# Patient Record
Sex: Male | Born: 1980
Health system: Southern US, Community
[De-identification: ages and names within clinical notes are randomized; demographics above are authoritative.]

## PROBLEM LIST (undated history)

## (undated) DIAGNOSIS — F419 Anxiety disorder, unspecified: Secondary | ICD-10-CM

## (undated) DIAGNOSIS — E7800 Pure hypercholesterolemia, unspecified: Secondary | ICD-10-CM

## (undated) DIAGNOSIS — F329 Major depressive disorder, single episode, unspecified: Secondary | ICD-10-CM

## (undated) HISTORY — DX: Major depressive disorder, single episode, unspecified: F32.9

## (undated) HISTORY — DX: Pure hypercholesterolemia, unspecified: E78.00

## (undated) HISTORY — DX: Anxiety disorder, unspecified: F41.9

---

## 2014-04-05 ENCOUNTER — Encounter: Payer: Self-pay | Admitting: Cardiovascular Disease

## 2014-04-07 ENCOUNTER — Encounter: Payer: Self-pay | Admitting: Cardiovascular Disease

## 2015-12-07 ENCOUNTER — Ambulatory Visit (INDEPENDENT_AMBULATORY_CARE_PROVIDER_SITE_OTHER): Payer: BLUE CROSS/BLUE SHIELD | Admitting: Psychiatry

## 2015-12-07 ENCOUNTER — Encounter (HOSPITAL_COMMUNITY): Payer: Self-pay | Admitting: Psychiatry

## 2015-12-07 VITALS — BP 159/108 | HR 67 | Ht 68.0 in | Wt 155.4 lb

## 2015-12-07 DIAGNOSIS — F411 Generalized anxiety disorder: Secondary | ICD-10-CM

## 2015-12-07 MED ORDER — VILAZODONE HCL 20 MG PO TABS: 20.0000 mg | ORAL_TABLET | Freq: Every day | ORAL | Status: DC

## 2015-12-07 MED ORDER — CLONAZEPAM 0.5 MG PO TABS: 0.2500 mg | ORAL_TABLET | Freq: Two times a day (BID) | ORAL | Status: DC | PRN

## 2015-12-07 NOTE — Progress Notes (Signed)
Psychiatric Initial Adult Assessment   Patient Identification: Jason [name] MRN:  [id number] Date of Evaluation:  12/07/2015 Referral Source: Dr. Selinda Flavin Chief Complaint:   Chief Complaint    Depression; Anxiety; Establish Care     Visit Diagnosis:    ICD-9-CM ICD-10-CM   1. Generalized anxiety disorder 300.02 F41.1     History of Present Illness:  This patient is a 35 year old married white male who lives with his wife in [place]. They have no children. He is a Tax adviser for [employer].  The patient was referred by his primary physician, Dr. Selinda Flavin for further assessment of depression and anxiety.  The patient states that he is always been a little bit on the depressive side and anxious person who is shy. However over the last 7 or 8 months he is become excessively anxious and worried. He wakes up with a feeling of dread and it doesn't go away. He's had little drive motivation or interest in doing anything that he usually enjoys. He's had several stressors. His wife has been very sick and has no energy and may have MS or another autoimmune disorder. Her doctors have not been able to figure out what is wrong. She does very little other than sleep all day.  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 He becomes anxious shy and panicky. His company was bought by a new firm and they are expecting a lot more out of him which is made him very anxious. He states that his eyes have difficulty with social anxiety. But lately he's had trouble even talking to his friends and just "can't come up with anything to say." He feels very awkward and self-conscious much of the time.  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 He also enjoys painting. He has various projects that he just can't complete. His mind races and he  can't decide what to do first. He often feels depressed and sad and feels like crying but can't. His energy is low. He denies suicidal ideation. He eats well and exercises 4-5 times a week. He doesn't use drugs and drinks very little. He sleeps very well but always feels tired. He has been tried on several antidepressants. Zoloft helped but made him very drowsy. Prozac and Celexa did not help and Wellbutrin made him feel wired. He's currently on a low dose of clonazepam but it makes him drowsy as well. He's never had past psychiatric treatment or counseling.  Associated Signs/Symptoms: Depression Symptoms:  depressed mood, anhedonia, psychomotor retardation, fatigue, feelings of worthlessness/guilt, difficulty concentrating, anxiety, loss of energy/fatigue,  Anxiety Symptoms:  Excessive Worry, Panic Symptoms, Social Anxiety,   Past Psychiatric History: none  Previous Psychotropic Medications: Yes   Substance Abuse History in the last 12 months:  No.  Consequences of Substance Abuse: NA  Past Medical History:  Past Medical History  Diagnosis Date  . Elevated cholesterol    History reviewed. No pertinent past surgical history.  Family Psychiatric History: none  Family History: History reviewed. No pertinent family history.  Social History:   Social History   Social History  . Marital Status: Married    Spouse Name: N/A  . Number of Children: N/A  . Years of Education: N/A   Social History Main Topics  . Smoking status: Former Games developer  . Smokeless tobacco:  Never Used     Comment: Quit 06-2008  . Alcohol Use: No     Comment: 12-07-15 per pt no  . Drug Use: No     Comment: 12-07-2015 per pt no  . Sexual Activity: Yes   Other Topics Concern  . None   Social History Narrative  . None    Additional Social History: Patient grew up with both parents and one older sister. He denies any history of trauma or abuse but states his parents are very poor and often had their  lights cut off. He was often embarrassed to bring friends home. 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 He has been married for 7 years but has no children.  Allergies:  No Known Allergies  Metabolic Disorder Labs: No results found for: HGBA1C, MPG No results found for: PROLACTIN No results found for: CHOL, TRIG, HDL, CHOLHDL, VLDL, LDLCALC   Current Medications: Current Outpatient Prescriptions  Medication Sig Dispense Refill  . acyclovir (ZOVIRAX) 200 MG capsule Take 200 mg by mouth 2 (two) times daily.   3  . clonazePAM (KLONOPIN) 0.5 MG tablet Take 0.5 tablets (0.25 mg total) by mouth 2 (two) times daily as needed. 60 tablet 2  . Vilazodone HCl (VIIBRYD) 20 MG TABS Take 1 tablet (20 mg total) by mouth at bedtime. 30 tablet 2   No current facility-administered medications for this visit.    Neurologic: Headache: No Seizure: No Paresthesias:No  Musculoskeletal: Strength & Muscle Tone: within normal limits Gait & Station: normal Patient leans: N/A  Psychiatric Specialty Exam: Review of Systems  Constitutional: Positive for malaise/fatigue.  Psychiatric/Behavioral: Positive for depression. The patient is nervous/anxious.     Blood pressure 159/108, pulse 67, height  (1.727 m), weight 155 lb 6.4 oz (70.489 kg), SpO2 99 %.Body mass index is 23.63 kg/(m^2).  General Appearance: Casual, Neat and Well Groomed  Eye Contact:  Fair  Speech:  Clear and Coherent  Volume:  Decreased  Mood:  Anxious and Dysphoric  Affect:  Constricted and Depressed  Thought Process:  Goal Directed  Orientation:  Full (Time, Place, and Person)  Thought Content:  Rumination  Suicidal Thoughts:  No  Homicidal Thoughts:  No  Memory:  Immediate;   Good Recent;    Good Remote;   Good  Judgement:  Good  Insight:  Fair  Psychomotor Activity:  Normal  Concentration:  Poor  Recall:  Good  Fund of Knowledge:Good  Language: Good  Akathisia:  No  Handed:  Right  AIMS (if indicated):    Assets:  Communication Skills Desire for Improvement Physical Health Resilience Social Support Vocational/Educational  ADL's:  Intact  Cognition: WNL  Sleep:  good    Treatment Plan Summary: Medication management   This patient is a 35 year old male with a history of significant anxiety depression and dominant social anxiety. He has never had any psychiatric treatment before and cognitive behavioral therapy would help him greatly. He'll be assigned a therapist here. He's tried several SSRIs but may benefit from yet another trial. He will start Viibryd 10 mg daily for 1 week and then advance to 20 mg. He can continue the clonazepam 0.25 mg as needed for anxiety. He'll return in 4 weeks  Diannia RuderOSS, DEBORAH, MD 4/12/201711:23 AM

## 2015-12-12 ENCOUNTER — Telehealth (HOSPITAL_COMMUNITY): Payer: Self-pay | Admitting: *Deleted

## 2015-12-12 NOTE — Telephone Encounter (Signed)
Prior authorization for Viibryd received. Called [employer] was told to submit online with cover my meds. Submitted online and awaiting response to be faxed.

## 2015-12-13 NOTE — Telephone Encounter (Signed)
noted 

## 2016-01-04 ENCOUNTER — Ambulatory Visit (INDEPENDENT_AMBULATORY_CARE_PROVIDER_SITE_OTHER): Payer: BLUE CROSS/BLUE SHIELD | Admitting: Psychology

## 2016-01-05 ENCOUNTER — Ambulatory Visit (INDEPENDENT_AMBULATORY_CARE_PROVIDER_SITE_OTHER): Payer: BLUE CROSS/BLUE SHIELD | Admitting: Psychiatry

## 2016-01-05 ENCOUNTER — Encounter (HOSPITAL_COMMUNITY): Payer: Self-pay | Admitting: Psychiatry

## 2016-01-05 VITALS — BP 134/79 | HR 70 | Ht 68.0 in | Wt 159.0 lb

## 2016-01-05 DIAGNOSIS — F411 Generalized anxiety disorder: Secondary | ICD-10-CM

## 2016-01-05 MED ORDER — VILAZODONE HCL 20 MG PO TABS: 20.0000 mg | ORAL_TABLET | Freq: Every day | ORAL | Status: DC

## 2016-01-05 NOTE — Progress Notes (Signed)
Patient ID: Jason [name], male   DOB: 1981-03-31, 35 y.o.   MRN: [id number]  Psychiatric  Adult follow-up  Patient Identification: Jason [name] MRN:  [id number] Date of Evaluation:  01/05/2016 Referral Source: Dr. Selinda Flavin Chief Complaint:   Chief Complaint    Depression; Anxiety; Follow-up     Visit Diagnosis:    ICD-9-CM ICD-10-CM   1. Generalized anxiety disorder 300.02 F41.1     History of Present Illness:  This patient is a 35 year old married white male who lives with his wife in [place]. They have no children. He is a Tax adviser for [employer].  The patient was referred by his primary physician, Dr. Selinda Flavin for further assessment of depression and anxiety.  The patient states that he is always been a little bit on the depressive side and anxious person who is shy. However over the last 7 or 8 months he is become excessively anxious and worried. He wakes up with a feeling of dread and it doesn't go away. He's had little drive motivation or interest in doing anything that he usually enjoys. He's had several stressors. His wife has been very sick and has no energy and may have MS or another autoimmune disorder. Her doctors have not been able to figure out what is wrong. She does very little other than sleep all day.  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 He becomes anxious shy and panicky. His company was bought by a new firm and they are expecting a lot more out of him which is made him very anxious. He states that his eyes have difficulty with social anxiety. But lately he's had trouble even talking to his friends and just "can't come up with anything to say." He feels very awkward and self-conscious much of the time.  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 He also enjoys painting. He has  various projects that he just can't complete. His mind races and he can't decide what to do first. He often feels depressed and sad and feels like crying but can't. His energy is low. He denies suicidal ideation. He eats well and exercises 4-5 times a week. He doesn't use drugs and drinks very little. He sleeps very well but always feels tired. He has been tried on several antidepressants. Zoloft helped but made him very drowsy. Prozac and Celexa did not help and Wellbutrin made him feel wired. He's currently on a low dose of clonazepam but it makes him drowsy as well. He's never had past psychiatric treatment or counseling  The patient returns after 4 weeks. He is now on Viibryd 20 mg daily. He's starting to feel little bit better and he no longer has a feeling of dread all the time. He is able to talk to people more easily. His energy has not improved that much and he still somewhat depressed. He still wishes he had a little bit more motivation.[name] He does definitely feel a difference on the viibryd and would like to give it some more time. He is just started counseling with Jonny Ruiz Rodenbaugh  Associated Signs/Symptoms: Depression Symptoms:  depressed mood, anhedonia, psychomotor retardation, fatigue, feelings of worthlessness/guilt, difficulty concentrating, anxiety, loss of energy/fatigue,  Anxiety Symptoms:  Excessive Worry, Panic Symptoms, Social Anxiety,   Past Psychiatric History: none  Previous Psychotropic Medications: Yes  Substance Abuse History in the last 12 months:  No.  Consequences of Substance Abuse: NA  Past Medical History:  Past Medical History  Diagnosis Date  . Elevated cholesterol    History reviewed. No pertinent past surgical history.  Family Psychiatric History: none  Family History: History reviewed. No pertinent family history.  Social History:   Social History   Social History  . Marital Status: Married    Spouse Name: N/A  . Number of Children:  N/A  . Years of Education: N/A   Social History Main Topics  . Smoking status: Former Games developermoker  . Smokeless tobacco: Never Used     Comment: Quit 06-2008  . Alcohol Use: No     Comment: 12-07-15 per pt no  . Drug Use: No     Comment: 12-07-2015 per pt no  . Sexual Activity: Yes   Other Topics Concern  . None   Social History Narrative    Additional Social History: Patient grew up with both parents and one older sister. He denies any history of trauma or abuse but states his parents are very poor and often had their lights cut off. He was often embarrassed to bring friends home. He was a fairly good Consulting civil engineerstudent but sometimes distracted in school and would rather draw than do his work. 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 He has been married for 7 years but has no children.  Allergies:  No Known Allergies  Metabolic Disorder Labs: No results found for: HGBA1C, MPG No results found for: PROLACTIN No results found for: CHOL, TRIG, HDL, CHOLHDL, VLDL, LDLCALC   Current Medications: Current Outpatient Prescriptions  Medication Sig Dispense Refill  . clonazePAM (KLONOPIN) 0.5 MG tablet Take 0.5 tablets (0.25 mg total) by mouth 2 (two) times daily as needed. 60 tablet 2  . Vilazodone HCl (VIIBRYD) 20 MG TABS Take 1 tablet (20 mg total) by mouth at bedtime. 30 tablet 2  . acyclovir (ZOVIRAX) 200 MG capsule Take 200 mg by mouth 2 (two) times daily. Reported on 01/05/2016  3   No current facility-administered medications for this visit.    Neurologic: Headache: No Seizure: No Paresthesias:No  Musculoskeletal: Strength & Muscle Tone: within normal limits Gait & Station: normal Patient leans: N/A  Psychiatric Specialty Exam: Review of Systems  Constitutional: Positive for malaise/fatigue.  Psychiatric/Behavioral: Positive for depression. The patient  is nervous/anxious.     Blood pressure 134/79, pulse 70, height 5\' 8"  (1.727 m), weight 159 lb (72.122 kg), SpO2 97 %.Body mass index is 24.18 kg/(m^2).  General Appearance: Casual, Neat and Well Groomed  Eye Contact:  Fair  Speech:  Clear and Coherent  Volume:  Decreased  Mood:  A little anxious   Affect:  Mildly constricted, better   Thought Process:  Goal Directed  Orientation:  Full (Time, Place, and Person)  Thought Content:  Rumination  Suicidal Thoughts:  No  Homicidal Thoughts:  No  Memory:  Immediate;   Good Recent;   Good Remote;   Good  Judgement:  Good  Insight:  Fair  Psychomotor Activity:  Normal  Concentration:  Poor  Recall:  Good  Fund of Knowledge:Good  Language: Good  Akathisia:  No  Handed:  Right  AIMS (if indicated):    Assets:  Communication Skills Desire for Improvement Physical Health Resilience Social Support Vocational/Educational  ADL's:  Intact  Cognition: WNL  Sleep:  good    Treatment Plan Summary: Medication management   The patient will continue Viibryd 20 mg daily as well as clonazepam 0.25 milligrams up to twice a day as needed. He will continue his counseling and return to see me in 6 weeks   [name], MD 5/11/20173:21 PM

## 2016-01-16 ENCOUNTER — Telehealth (HOSPITAL_COMMUNITY): Payer: Self-pay | Admitting: *Deleted

## 2016-01-16 NOTE — Telephone Encounter (Signed)
Pt called stating he is experiencing side effects from his medication and would like to know if there are other options. The medication pt is experiencing side effects from is Viibryd. Per pt, he noticed that since he started medication, he had been losing a lot of hair and researched it as well. Per pt, this medication is really helping but would rather try something else that won't make him lose his hair. Pt number is [phone].

## 2016-01-17 NOTE — Telephone Encounter (Signed)
He will need to come in

## 2016-01-17 NOTE — Telephone Encounter (Signed)
Pt scheduled f/u appt.../or

## 2016-01-18 ENCOUNTER — Encounter (HOSPITAL_COMMUNITY): Payer: Self-pay | Admitting: Psychiatry

## 2016-01-18 ENCOUNTER — Ambulatory Visit (INDEPENDENT_AMBULATORY_CARE_PROVIDER_SITE_OTHER): Payer: BLUE CROSS/BLUE SHIELD | Admitting: Psychiatry

## 2016-01-18 VITALS — BP 144/90 | HR 62 | Ht 68.0 in | Wt 158.6 lb

## 2016-01-18 DIAGNOSIS — F411 Generalized anxiety disorder: Secondary | ICD-10-CM

## 2016-01-18 MED ORDER — VENLAFAXINE HCL ER 75 MG PO CP24: 75.0000 mg | ORAL_CAPSULE | Freq: Every day | ORAL | Status: DC

## 2016-01-18 NOTE — Progress Notes (Signed)
Patient ID: Jason [name], male   DOB: [birth year], 35 y.o.   MRN: [id number] Patient ID: Jason [name], male   DOB: 08/24/1981, 35 y.o.   MRN: [id number]  Psychiatric  Adult follow-up  Patient Identification: Jason [name] MRN:  [id number] Date of Evaluation:  01/18/2016 Referral Source: Jason [name] Chief Complaint:   Chief Complaint    Depression; Anxiety; Follow-up     Visit Diagnosis:    ICD-9-CM ICD-10-CM   1. Generalized anxiety disorder 300.02 F41.1     History of Present Illness:  This patient is a 35 year old married white male who lives with his wife in [place]. They have no children. He is a Tax adviser for [employer].  The patient was referred by his primary physician, Jason [name] for further assessment of depression and anxiety.  The patient states that he is always been a little bit on the depressive side and anxious person who is shy. However over the last 7 or 8 months he is become excessively anxious and worried. He wakes up with a feeling of dread and it doesn't go away. He's had little drive motivation or interest in doing anything that he usually enjoys. He's had several stressors. His wife has been very sick and has no energy and may have MS or another autoimmune disorder. Her doctors have not been able to figure out what is wrong. She does very little other than sleep all day.  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 He becomes anxious shy and panicky. His company was bought by a new firm and they are expecting a lot more out of him which is made him very anxious. He states that his eyes have difficulty with social anxiety. But lately he's had trouble even talking to his friends and just "can't come up with anything to say." He feels very awkward and self-conscious much of the time.  The patient states that he is playing music and  abandoned hopes to take this professionally at some point. He also enjoys painting. He has various projects that he just can't complete. His mind races and he can't decide what to do first. He often feels depressed and sad and feels like crying but can't. His energy is low. He denies suicidal ideation. He eats well and exercises 4-5 times a week. He doesn't use drugs and drinks very little. He sleeps very well but always feels tired. He has been tried on several antidepressants. Zoloft helped but made him very drowsy. Prozac and Celexa did not help and Wellbutrin made him feel wired. He's currently on a low dose of clonazepam but it makes him drowsy as well. He's never had past psychiatric treatment or counseling  The patient returns after 2 weeks as a work in. He has noticed over the last couple of weeks and is having a great deal of hair loss. It is clogging up the drain in the shower. He has looked it up and determined that this is coming from the [place]. He would like to try something else. He is tried numerous medicines as noted above. We discussed various options and decided try an SSRI such as Effexor as he needs to have more energy. He is continuing with his counseling  Associated Signs/Symptoms: Depression Symptoms:  depressed mood, anhedonia, psychomotor retardation, fatigue, feelings of worthlessness/guilt, difficulty concentrating, anxiety, loss of energy/fatigue,  Anxiety Symptoms:  Excessive Worry, Panic Symptoms, Social Anxiety,   Past Psychiatric History: none  Previous Psychotropic Medications: Yes   Substance Abuse History in the last 12 months:  No.  Consequences of Substance Abuse: NA  Past Medical History:  Past Medical History  Diagnosis Date  . Elevated cholesterol    No past surgical history on file.  Family Psychiatric History: none  Family History: No family history on file.  Social History:   Social History   Social History  . Marital Status:  Married    Spouse Name: N/A  . Number of Children: N/A  . Years of Education: N/A   Social History Main Topics  . Smoking status: Former Games developer  . Smokeless tobacco: Never Used     Comment: Quit 06-2008  . Alcohol Use: No     Comment: 12-07-15 per pt no  . Drug Use: No     Comment: 12-07-2015 per pt no  . Sexual Activity: Yes   Other Topics Concern  . None   Social History Narrative    Additional Social History: Patient grew up with both parents and one older sister. He denies any history of trauma or abuse but states his parents are very poor and often had their lights cut off. He was often embarrassed to bring friends home. 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 He has been married for 7 years but has no children.  Allergies:  No Known Allergies  Metabolic Disorder Labs: No results found for: HGBA1C, MPG No results found for: PROLACTIN No results found for: CHOL, TRIG, HDL, CHOLHDL, VLDL, LDLCALC   Current Medications: Current Outpatient Prescriptions  Medication Sig Dispense Refill  . acyclovir (ZOVIRAX) 200 MG capsule Take 200 mg by mouth 2 (two) times daily. Reported on 01/05/2016  3  . clonazePAM (KLONOPIN) 0.5 MG tablet Take 0.5 tablets (0.25 mg total) by mouth 2 (two) times daily as needed. 60 tablet 2  . venlafaxine XR (EFFEXOR XR) 75 MG 24 hr capsule Take 1 capsule (75 mg total) by mouth daily. 30 capsule 2   No current facility-administered medications for this visit.    Neurologic: Headache: No Seizure: No Paresthesias:No  Musculoskeletal: Strength & Muscle Tone: within normal limits Gait & Station: normal Patient leans: N/A  Psychiatric Specialty Exam: Review of Systems  Constitutional: Positive for malaise/fatigue.   Psychiatric/Behavioral: Positive for depression. The patient is nervous/anxious.     Blood pressure 144/90, pulse 62, height 5\' 8"  (1.727 m), weight 158 lb 9.6 oz (71.94 kg), SpO2 98 %.Body mass index is 24.12 kg/(m^2).  General Appearance: Casual, Neat and Well Groomed  Eye Contact:  Fair  Speech:  Clear and Coherent  Volume:  Decreased  Mood:  A little anxious   Affect:  Mildly constricted  Thought Process:  Goal Directed  Orientation:  Full (Time, Place, and Person)  Thought Content:  Rumination  Suicidal Thoughts:  No  Homicidal Thoughts:  No  Memory:  Immediate;   Good Recent;   Good Remote;   Good  Judgement:  Good  Insight:  Fair  Psychomotor Activity:  Normal  Concentration:  Poor  Recall:  Good  Fund of Knowledge:Good  Language: Good  Akathisia:  No  Handed:  Right  AIMS (if indicated):    Assets:  Communication Skills Desire for Improvement Physical Health Resilience Social Support Vocational/Educational  ADL's:  Intact  Cognition: WNL  Sleep:  good    Treatment Plan Summary: Medication management   The patient will continueclonazepam 0.25 milligrams up to twice a day as needed. He will cut down Viibryd to 10 mg for a few days while he is starting Effexor XR 75 mg every morning He will continue his counseling and return to see me in 4 weeks   Diannia RuderOSS, DEBORAH, MD 5/24/201710:42 AM

## 2016-01-25 ENCOUNTER — Other Ambulatory Visit (HOSPITAL_COMMUNITY): Payer: Self-pay | Admitting: Psychiatry

## 2016-01-25 ENCOUNTER — Telehealth (HOSPITAL_COMMUNITY): Payer: Self-pay | Admitting: *Deleted

## 2016-01-25 MED ORDER — VENLAFAXINE HCL ER 37.5 MG PO CP24: 37.5000 mg | ORAL_CAPSULE | Freq: Every day | ORAL | Status: DC

## 2016-01-25 NOTE — Telephone Encounter (Signed)
Phone call from patient, said the Effexor is keeping him so drowsy during the day he can't hardly keep his eyes open.  He was taking one in the morning, but changed to takeing one at night hoping it would be better,  but it is still the same.

## 2016-01-25 NOTE — Telephone Encounter (Signed)
Route to [place]. I sent in lower dose to pharmacy

## 2016-01-25 NOTE — Telephone Encounter (Signed)
Informed pt and he showed understanding 

## 2016-02-06 ENCOUNTER — Telehealth (HOSPITAL_COMMUNITY): Payer: Self-pay | Admitting: *Deleted

## 2016-02-06 ENCOUNTER — Ambulatory Visit (INDEPENDENT_AMBULATORY_CARE_PROVIDER_SITE_OTHER): Payer: BLUE CROSS/BLUE SHIELD | Admitting: Psychology

## 2016-02-06 NOTE — Telephone Encounter (Signed)
He will need to come back in

## 2016-02-06 NOTE — Telephone Encounter (Signed)
Pt came into office to see another provider. Per pt, the Effexor is still not working. Per pt, even with the lower dose, he do not feel like it is working. Pt number is [phone].

## 2016-02-06 NOTE — Telephone Encounter (Signed)
Called pt and sooner appt was made. Pt verbalized understanding.

## 2016-02-07 ENCOUNTER — Ambulatory Visit (INDEPENDENT_AMBULATORY_CARE_PROVIDER_SITE_OTHER): Payer: BLUE CROSS/BLUE SHIELD | Admitting: Psychiatry

## 2016-02-07 ENCOUNTER — Encounter (HOSPITAL_COMMUNITY): Payer: Self-pay | Admitting: Psychiatry

## 2016-02-07 VITALS — BP 136/86 | HR 70 | Ht 68.0 in | Wt 156.4 lb

## 2016-02-07 DIAGNOSIS — F411 Generalized anxiety disorder: Secondary | ICD-10-CM | POA: Diagnosis not present

## 2016-02-07 MED ORDER — VORTIOXETINE HBR 10 MG PO TABS: 10.0000 mg | ORAL_TABLET | Freq: Every day | ORAL | Status: DC

## 2016-02-07 NOTE — Progress Notes (Signed)
Patient ID: Jason [name], male   DOB: 27-Jan-1981, 35 y.o.   MRN: [id number] Patient ID: Jason [name], male   DOB: September 20, 1980, 20 y.o.   MRN: [id number] Patient ID: Jason [name], male   DOB: 19-Jan-1981, 35 y.o.   MRN: [id number]  Psychiatric  Adult follow-up  Patient Identification: Jason [name] MRN:  [id number] Date of Evaluation:  02/07/2016 Referral Source: Dr. Selinda Flavin Chief Complaint:   Chief Complaint    Depression; Anxiety; Follow-up     Visit Diagnosis:    ICD-9-CM ICD-10-CM   1. Generalized anxiety disorder 300.02 F41.1     History of Present Illness:  This patient is a 35 year old married white male who lives with his wife in [place]. They have no children. He is a Tax adviser for [employer].  The patient was referred by his primary physician, Dr. Selinda Flavin for further assessment of depression and anxiety.  The patient states that he is always been a little bit on the depressive side and anxious person who is shy. However over the last 7 or 8 months he is become excessively anxious and worried. He wakes up with a feeling of dread and it doesn't go away. He's had little drive motivation or interest in doing anything that he usually enjoys. He's had several stressors. His wife has been very sick and has no energy and may have MS or another autoimmune disorder. Her doctors have not been able to figure out what is wrong. She does very little other than sleep all day.  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 He becomes anxious shy and panicky. His company was bought by a new firm and they are expecting a lot more out of him which is made him very anxious. He states that his eyes have difficulty with social anxiety. But lately he's had trouble even talking to his friends and just "can't come up with anything to say." He feels very awkward and  self-conscious much of the time.  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 He also enjoys painting. He has various projects that he just can't complete. His mind races and he can't decide what to do first. He often feels depressed and sad and feels like crying but can't. His energy is low. He denies suicidal ideation. He eats well and exercises 4-5 times a week. He doesn't use drugs and drinks very little. He sleeps very well but always feels tired. He has been tried on several antidepressants. Zoloft helped but made him very drowsy. Prozac and Celexa did not help and Wellbutrin made him feel wired. He's currently on a low dose of clonazepam but it makes him drowsy as well. He's never had past psychiatric treatment or counseling  The patient returns after 2 weeks as a work in. Last visit we had to stop the Viibryd because he was having some much hair loss. He was changed to Effexor XR 37.5 mg. This medication is making him feel totally drained tired and unable to function. He is now tried quite a few antidepressants. He's not sure that he actually gave Prozac a good enough chance when he was younger but would like to try something new or so I suggested we moved to Trintellix. If this doesn't work out we can always go back to Prozac  or try Lexapro.  Associated Signs/Symptoms: Depression Symptoms:  depressed mood, anhedonia, psychomotor retardation, fatigue, feelings of worthlessness/guilt, difficulty concentrating, anxiety, loss of energy/fatigue,  Anxiety Symptoms:  Excessive Worry, Panic Symptoms, Social Anxiety,   Past Psychiatric History: none  Previous Psychotropic Medications: Yes   Substance Abuse History in the last 12 months:  No.  Consequences of Substance Abuse: NA  Past Medical History:  Past Medical History  Diagnosis Date  . Elevated cholesterol    History reviewed. No pertinent past surgical  history.  Family Psychiatric History: none  Family History: History reviewed. No pertinent family history.  Social History:   Social History   Social History  . Marital Status: Married    Spouse Name: N/A  . Number of Children: N/A  . Years of Education: N/A   Social History Main Topics  . Smoking status: Former Games developermoker  . Smokeless tobacco: Never Used     Comment: Quit 06-2008  . Alcohol Use: No     Comment: 12-07-15 per pt no  . Drug Use: No     Comment: 12-07-2015 per pt no  . Sexual Activity: Yes   Other Topics Concern  . None   Social History Narrative    Additional Social History: Patient grew up with both parents and one older sister. He denies any history of trauma or abuse but states his parents are very poor and often had their lights cut off. He was often embarrassed to bring friends home. He was a fairly good Consulting civil engineerstudent but sometimes distracted in school and would rather draw than do his work. 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 He has been married for 7 years but has no children.  Allergies:  No Known Allergies  Metabolic Disorder Labs: No results found for: HGBA1C, MPG No results found for: PROLACTIN No results found for: CHOL, TRIG, HDL, CHOLHDL, VLDL, LDLCALC   Current Medications: Current Outpatient Prescriptions  Medication Sig Dispense Refill  . acyclovir (ZOVIRAX) 200 MG capsule Take 200 mg by mouth 2 (two) times daily. Reported on 01/05/2016  3  . clonazePAM (KLONOPIN) 0.5 MG tablet Take 0.5 tablets (0.25 mg total) by mouth 2 (two) times daily as needed. 60 tablet 2  . vortioxetine HBr (TRINTELLIX) 10 MG TABS Take 1 tablet (10 mg total) by mouth daily. 30 tablet 2   No current facility-administered medications for this visit.    Neurologic: Headache: No Seizure: No Paresthesias:No  Musculoskeletal: Strength &  Muscle Tone: within normal limits Gait & Station: normal Patient leans: N/A  Psychiatric Specialty Exam: Review of Systems  Constitutional: Positive for malaise/fatigue.  Psychiatric/Behavioral: Positive for depression. The patient is nervous/anxious.     Blood pressure 136/86, pulse 70, height 5\' 8"  (1.727 m), weight 156 lb 6.4 oz (70.943 kg), SpO2 98 %.Body mass index is 23.79 kg/(m^2).  General Appearance: Casual, Neat and Well Groomed  Eye Contact:  Fair  Speech:  Clear and Coherent  Volume:  Decreased  Mood:   Anxious, states he also feels depressed   Affect:  Mildly constricted  Thought Process:  Goal Directed  Orientation:  Full (Time, Place, and Person)  Thought Content:  Rumination  Suicidal Thoughts:  No  Homicidal Thoughts:  No  Memory:  Immediate;   Good Recent;   Good Remote;   Good  Judgement:  Good  Insight:  Fair  Psychomotor Activity:  Normal  Concentration:  Poor  Recall:  Good  Fund of Knowledge:Good  Language: Good  Akathisia:  No  Handed:  Right  AIMS (if indicated):    Assets:  Communication Skills Desire for Improvement Physical Health Resilience Social Support Vocational/Educational  ADL's:  Intact  Cognition: WNL  Sleep:  good    Treatment Plan Summary: Medication management   The patient will continueclonazepam 0.25 milligrams up to twice a day as needed. He We'll discontinue Effexor XR and start print Trintellix 5 mg daily for 1 week and then advance to 10 mg daily. He will continue his counseling and return to see me in 4 weeks   [name], MD 6/13/201710:40 AM

## 2016-02-15 ENCOUNTER — Ambulatory Visit (HOSPITAL_COMMUNITY): Payer: BLUE CROSS/BLUE SHIELD | Admitting: Psychiatry

## 2016-02-15 ENCOUNTER — Telehealth (HOSPITAL_COMMUNITY): Payer: Self-pay | Admitting: *Deleted

## 2016-02-15 NOTE — Telephone Encounter (Signed)
Prior authorization for Trintellix received. Submitted online with cover my meds. Awaiting decision.

## 2016-02-16 ENCOUNTER — Ambulatory Visit (HOSPITAL_COMMUNITY): Payer: BLUE CROSS/BLUE SHIELD | Admitting: Psychiatry

## 2016-02-16 NOTE — Telephone Encounter (Signed)
noted 

## 2016-02-16 NOTE — Telephone Encounter (Signed)
Pt is aware and verbalized understanding.

## 2016-03-13 ENCOUNTER — Ambulatory Visit (HOSPITAL_COMMUNITY): Payer: BLUE CROSS/BLUE SHIELD | Admitting: Psychology

## 2016-03-14 ENCOUNTER — Ambulatory Visit (INDEPENDENT_AMBULATORY_CARE_PROVIDER_SITE_OTHER): Payer: BLUE CROSS/BLUE SHIELD | Admitting: Psychiatry

## 2016-03-14 ENCOUNTER — Encounter (HOSPITAL_COMMUNITY): Payer: Self-pay | Admitting: Psychiatry

## 2016-03-14 VITALS — BP 142/83 | HR 66 | Ht 68.0 in | Wt 161.2 lb

## 2016-03-14 DIAGNOSIS — F411 Generalized anxiety disorder: Secondary | ICD-10-CM

## 2016-03-14 MED ORDER — VORTIOXETINE HBR 10 MG PO TABS: 10.0000 mg | ORAL_TABLET | Freq: Every day | ORAL | Status: DC

## 2016-03-14 MED ORDER — CLONAZEPAM 0.5 MG PO TABS: 0.2500 mg | ORAL_TABLET | Freq: Two times a day (BID) | ORAL | Status: DC | PRN

## 2016-03-14 NOTE — Progress Notes (Signed)
Patient ID: Jason [name], male   DOB: Jan 09, 1981, 35 y.o.   MRN: [id number] Patient ID: Jason [name], male   DOB: Jun 26, 1981, 35 y.o.   MRN: [id number] Patient ID: Jason [name], male   DOB: 05-12-1981, 35 y.o.   MRN: [id number]  Psychiatric  Adult follow-up  Patient Identification: Jason [name] MRN:  [id number] Date of Evaluation:  03/14/2016 Referral Source: Dr. Selinda Flavin Chief Complaint:   Chief Complaint    Depression; Anxiety; Follow-up     Visit Diagnosis:    ICD-9-CM ICD-10-CM   1. Generalized anxiety disorder 300.02 F41.1     History of Present Illness:  This patient is a 35 year old married white male who lives with his wife in [place]. They have no children. He is a Tax adviser for [employer].  The patient was referred by his primary physician, Dr. Selinda Flavin for further assessment of depression and anxiety.  The patient states that he is always been a little bit on the depressive side and anxious person who is shy. However over the last 7 or 8 months he is become excessively anxious and worried. He wakes up with a feeling of dread and it doesn't go away. He's had little drive motivation or interest in doing anything that he usually enjoys. He's had several stressors. His wife has been very sick and has no energy and may have MS or another autoimmune disorder. Her doctors have not been able to figure out what is wrong. She does very little other than sleep all day.  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 He becomes anxious shy and panicky. His company was bought by a new firm and they are expecting a lot more out of him which is made him very anxious. He states that his eyes have difficulty with social anxiety. But lately he's had trouble even talking to his friends and just "can't come up with anything to say." He feels very awkward and  self-conscious much of the time.  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 He also enjoys painting. He has various projects that he just can't complete. His mind races and he can't decide what to do first. He often feels depressed and sad and feels like crying but can't. His energy is low. He denies suicidal ideation. He eats well and exercises 4-5 times a week. He doesn't use drugs and drinks very little. He sleeps very well but always feels tired. He has been tried on several antidepressants. Zoloft helped but made him very drowsy. Prozac and Celexa did not help and Wellbutrin made him feel wired. He's currently on a low dose of clonazepam but it makes him drowsy as well. He's never had past psychiatric treatment or counseling  The patient returns after 4 weeks. Last time he was having a good deal of hair loss from Viibryd so we have switched him to Trintellix. He is currently on 10 mg and doing well. His mood is been stable he is less anxious and apprehensive at work and he is sleeping very well. It makes him a bit drowsy so he takes it in the evening with food. His energy is good and he doesn't have any specific complaints. He is having much less hair loss now  Associated Signs/Symptoms: Depression Symptoms:  depressed mood, anhedonia, psychomotor  retardation, fatigue, feelings of worthlessness/guilt, difficulty concentrating, anxiety, loss of energy/fatigue,  Anxiety Symptoms:  Excessive Worry, Panic Symptoms, Social Anxiety,   Past Psychiatric History: none  Previous Psychotropic Medications: Yes   Substance Abuse History in the last 12 months:  No.  Consequences of Substance Abuse: NA  Past Medical History:  Past Medical History  Diagnosis Date  . Elevated cholesterol    History reviewed. No pertinent past surgical history.  Family Psychiatric History: none  Family History: History reviewed. No pertinent  family history.  Social History:   Social History   Social History  . Marital Status: Married    Spouse Name: N/A  . Number of Children: N/A  . Years of Education: N/A   Social History Main Topics  . Smoking status: Former Games developermoker  . Smokeless tobacco: Never Used     Comment: Quit 06-2008  . Alcohol Use: No     Comment: 12-07-15 per pt no  . Drug Use: No     Comment: 12-07-2015 per pt no  . Sexual Activity: Yes   Other Topics Concern  . None   Social History Narrative    Additional Social History: Patient grew up with both parents and one older sister. He denies any history of trauma or abuse but states his parents are very poor and often had their lights cut off. He was often embarrassed to bring friends home. He was a fairly good Consulting civil engineerstudent but sometimes distracted in school and would rather draw than do his work. 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 He has been married for 7 years but has no children.  Allergies:  No Known Allergies  Metabolic Disorder Labs: No results found for: HGBA1C, MPG No results found for: PROLACTIN No results found for: CHOL, TRIG, HDL, CHOLHDL, VLDL, LDLCALC   Current Medications: Current Outpatient Prescriptions  Medication Sig Dispense Refill  . acyclovir (ZOVIRAX) 200 MG capsule Take 200 mg by mouth 2 (two) times daily. Reported on 01/05/2016  3  . clonazePAM (KLONOPIN) 0.5 MG tablet Take 0.5 tablets (0.25 mg total) by mouth 2 (two) times daily as needed. 60 tablet 2  . vortioxetine HBr (TRINTELLIX) 10 MG TABS Take 1 tablet (10 mg total) by mouth daily. 30 tablet 2   No current facility-administered medications for this visit.    Neurologic: Headache: No Seizure: No Paresthesias:No  Musculoskeletal: Strength & Muscle Tone: within normal limits Gait & Station: normal Patient leans: N/A  Psychiatric  Specialty Exam: Review of Systems  Constitutional: Positive for malaise/fatigue.  Psychiatric/Behavioral: Positive for depression. The patient is nervous/anxious.     Blood pressure 142/83, pulse 66, height 5\' 8"  (1.727 m), weight 161 lb 3.2 oz (73.12 kg).Body mass index is 24.52 kg/(m^2).  General Appearance: Casual, Neat and Well Groomed  Eye Contact:  Fair  Speech:  Clear and Coherent  Volume:  Decreased  Mood:  Good   Affect:  Brighter   Thought Process:  Goal Directed  Orientation:  Full (Time, Place, and Person)  Thought Content:  Rumination  Suicidal Thoughts:  No  Homicidal Thoughts:  No  Memory:  Immediate;   Good Recent;   Good Remote;   Good  Judgement:  Good  Insight:  Fair  Psychomotor Activity:  Normal  Concentration:  Poor  Recall:  Good  Fund of Knowledge:Good  Language: Good  Akathisia:  No  Handed:  Right  AIMS (if indicated):    Assets:  Communication Skills Desire for Improvement Physical Health Resilience Social Support Vocational/Educational  ADL's:  Intact  Cognition: WNL  Sleep:  good    Treatment Plan Summary: Medication management   The patient will continueclonazepam 0.25 milligrams up to twice a day as needed. He will continue  Trintellix   10 mg daily. He will continue his counseling and return to see me in 3 months   [name], MD 7/19/20178:43 AM

## 2016-03-23 ENCOUNTER — Ambulatory Visit (HOSPITAL_COMMUNITY): Payer: BLUE CROSS/BLUE SHIELD | Admitting: Psychology

## 2016-06-11 ENCOUNTER — Encounter (HOSPITAL_COMMUNITY): Payer: Self-pay | Admitting: Psychiatry

## 2016-06-11 ENCOUNTER — Ambulatory Visit (INDEPENDENT_AMBULATORY_CARE_PROVIDER_SITE_OTHER): Payer: BLUE CROSS/BLUE SHIELD | Admitting: Psychiatry

## 2016-06-11 VITALS — BP 125/85 | HR 63 | Ht 68.0 in | Wt 159.2 lb

## 2016-06-11 DIAGNOSIS — Z87891 Personal history of nicotine dependence: Secondary | ICD-10-CM

## 2016-06-11 DIAGNOSIS — F411 Generalized anxiety disorder: Secondary | ICD-10-CM

## 2016-06-11 MED ORDER — VORTIOXETINE HBR 20 MG PO TABS: 20.0000 mg | ORAL_TABLET | Freq: Every day | ORAL | 2 refills | Status: DC

## 2016-06-11 MED ORDER — BUSPIRONE HCL 10 MG PO TABS: 10.0000 mg | ORAL_TABLET | Freq: Three times a day (TID) | ORAL | 2 refills | Status: DC

## 2016-06-11 MED ORDER — CLONAZEPAM 0.5 MG PO TABS: 0.2500 mg | ORAL_TABLET | Freq: Two times a day (BID) | ORAL | 2 refills | Status: DC | PRN

## 2016-06-11 NOTE — Progress Notes (Signed)
Patient ID: Jason [name], male   DOB: 21-Dec-1980, 35 y.o.   MRN: [id number] Patient ID: Jason [name], male   DOB: 1981-01-15, 35 y.o.   MRN: [id number] Patient ID: Jason [name], male   DOB: 15-May-1981, 35 y.o.   MRN: [id number]  Psychiatric  Adult follow-up  Patient Identification: Jason [name] MRN:  [id number] Date of Evaluation:  06/11/2016 Referral Source: Dr. Selinda [name] Chief Complaint:   Chief Complaint    Depression; Anxiety; Follow-up     Visit Diagnosis:    ICD-9-CM ICD-10-CM   1. Generalized anxiety disorder 300.02 F41.1     History of Present Illness:  This patient is a 35 year old married white male who lives with his wife in [place]. They have no children. He is a Tax adviser for [employer].  The patient was referred by his primary physician, Dr. Selinda [name] for further assessment of depression and anxiety.  The patient states that he is always been a little bit on the depressive side and anxious person who is shy. However over the last 7 or 8 months he is become excessively anxious and worried. He wakes up with a feeling of dread and it doesn't go away. He's had little drive motivation or interest in doing anything that he usually enjoys. He's had several stressors. His wife has been very sick and has no energy and may have MS or another autoimmune disorder. Her doctors have not been able to figure out what is wrong. She does very little other than sleep all day.  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 He becomes anxious shy and panicky. His company was bought by a new firm and they are expecting a lot more out of him which is made him very anxious. He states that his eyes have difficulty with social anxiety. But lately he's had trouble even talking to his friends and just "can't come up with anything to say." He feels very awkward and  self-conscious much of the time.  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 He also enjoys painting. He has various projects that he just can't complete. His mind races and he can't decide what to do first. He often feels depressed and sad and feels like crying but can't. His energy is low. He denies suicidal ideation. He eats well and exercises 4-5 times a week. He doesn't use drugs and drinks very little. He sleeps very well but always feels tired. He has been tried on several antidepressants. Zoloft helped but made him very drowsy. Prozac and Celexa did not help and Wellbutrin made him feel wired. He's currently on a low dose of clonazepam but it makes him drowsy as well. He's never had past psychiatric treatment or counseling  The patient returns after 3 months. He still not doing that well. At first the trintellix seems like it was helping. He has less depressed but he still has no energy or motivation to do anything. His job drains him and he is applying for some other jobs. He still has significant social anxiety. He uses the clonazepam but pretty much only takes it at bedtime because it makes him drowsy if he takes it in the day. I suggested that first we increase the trintellix to 20 mg and add BuSpar during the day for anxiety. He  was seeing Jason [name] here but didn't feel like it was that helpful and I suggested because insurance for other recommendations for therapy.  Associated Signs/Symptoms: Depression Symptoms:  depressed mood, anhedonia, psychomotor retardation, fatigue, feelings of worthlessness/guilt, difficulty concentrating, anxiety, loss of energy/fatigue,  Anxiety Symptoms:  Excessive Worry, Panic Symptoms, Social Anxiety,   Past Psychiatric History: none  Previous Psychotropic Medications: Yes   Substance Abuse History in the last 12 months:  No.  Consequences of Substance Abuse: NA  Past  Medical History:  Past Medical History:  Diagnosis Date  . Elevated cholesterol    No past surgical history on file.  Family Psychiatric History: none  Family History: No family history on file.  Social History:   Social History   Social History  . Marital status: Married    Spouse name: N/A  . Number of children: N/A  . Years of education: N/A   Social History Main Topics  . Smoking status: Former Games developermoker  . Smokeless tobacco: Never Used     Comment: Quit 06-2008  . Alcohol use No     Comment: 12-07-15 per pt no  . Drug use: No     Comment: 12-07-2015 per pt no  . Sexual activity: Yes   Other Topics Concern  . None   Social History Narrative  . None    Additional Social History: Patient grew up with both parents and one older sister. He denies any history of trauma or abuse but states his parents are very poor and often had their lights cut off. He was often embarrassed to bring friends home. He was a fairly good Consulting civil engineerstudent but sometimes distracted in school and would rather draw than do his work. 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 He has been married for 7 years but has no children.  Allergies:  No Known Allergies  Metabolic Disorder Labs: No results found for: HGBA1C, MPG No results found for: PROLACTIN No results found for: CHOL, TRIG, HDL, CHOLHDL, VLDL, LDLCALC   Current Medications: Current Outpatient Prescriptions  Medication Sig Dispense Refill  . acyclovir (ZOVIRAX) 200 MG capsule Take 200 mg by mouth 2 (two) times daily. Reported on 01/05/2016  3  . clonazePAM (KLONOPIN) 0.5 MG tablet Take 0.5 tablets (0.25 mg total) by mouth 2 (two) times daily as needed. 60 tablet 2  . busPIRone (BUSPAR) 10 MG tablet Take 1 tablet (10 mg total) by mouth 3 (three) times daily. 90 tablet 2  . vortioxetine HBr (TRINTELLIX) 20 MG TABS Take 20  mg by mouth daily. 30 tablet 2   No current facility-administered medications for this visit.     Neurologic: Headache: No Seizure: No Paresthesias:No  Musculoskeletal: Strength & Muscle Tone: within normal limits Gait & Station: normal Patient leans: N/A  Psychiatric Specialty Exam: Review of Systems  Constitutional: Positive for malaise/fatigue.  Psychiatric/Behavioral: Positive for depression. The patient is nervous/anxious.     Blood pressure 125/85, pulse 63, height 5\' 8"  (1.727 m), weight 159 lb 3.2 oz (72.2 kg).Body mass index is 24.21 kg/m.  General Appearance: Casual, Neat and Well Groomed  Eye Contact:  Fair  Speech:  Clear and Coherent  Volume:  Decreased  Mood:  Anxious   Affect:Congruent   Thought Process:  Goal Directed  Orientation:  Full (Time, Place, and Person)  Thought Content:  Rumination  Suicidal Thoughts:  No  Homicidal Thoughts:  No  Memory:  Immediate;   Good Recent;   Good Remote;   Good  Judgement:  Good  Insight:  Fair  Psychomotor Activity:  Normal  Concentration:  Poor  Recall:  Good  Fund of Knowledge:Good  Language: Good  Akathisia:  No  Handed:  Right  AIMS (if indicated):    Assets:  Communication Skills Desire for Improvement Physical Health Resilience Social Support Vocational/Educational  ADL's:  Intact  Cognition: WNL  Sleep:  good    Treatment Plan Summary: Medication management   The patient will continueclonazepam 0.25 milligrams up to twice a day as needed. He will continue  Trintellix  But increase the dose to 20 mg daily. He will start BuSpar 10 mg 3 times a day. He'll return to see me in 4 weeks   [name], MD 10/16/20174:41 PM

## 2016-06-14 ENCOUNTER — Ambulatory Visit (HOSPITAL_COMMUNITY): Payer: BLUE CROSS/BLUE SHIELD | Admitting: Psychiatry

## 2016-07-11 ENCOUNTER — Ambulatory Visit (HOSPITAL_COMMUNITY): Payer: BLUE CROSS/BLUE SHIELD | Admitting: Psychiatry

## 2016-07-17 ENCOUNTER — Encounter (HOSPITAL_COMMUNITY): Payer: Self-pay | Admitting: Psychiatry

## 2016-07-17 ENCOUNTER — Ambulatory Visit (INDEPENDENT_AMBULATORY_CARE_PROVIDER_SITE_OTHER): Payer: BLUE CROSS/BLUE SHIELD | Admitting: Psychiatry

## 2016-07-17 VITALS — BP 131/87 | HR 72 | Resp 18 | Wt 159.2 lb

## 2016-07-17 DIAGNOSIS — Z79899 Other long term (current) drug therapy: Secondary | ICD-10-CM | POA: Diagnosis not present

## 2016-07-17 DIAGNOSIS — Z87891 Personal history of nicotine dependence: Secondary | ICD-10-CM

## 2016-07-17 DIAGNOSIS — F411 Generalized anxiety disorder: Secondary | ICD-10-CM | POA: Diagnosis not present

## 2016-07-17 MED ORDER — CLONAZEPAM 0.5 MG PO TABS: 0.2500 mg | ORAL_TABLET | Freq: Two times a day (BID) | ORAL | 2 refills | Status: DC | PRN

## 2016-07-17 MED ORDER — BUSPIRONE HCL 10 MG PO TABS: 10.0000 mg | ORAL_TABLET | Freq: Three times a day (TID) | ORAL | 2 refills | Status: DC

## 2016-07-17 NOTE — Progress Notes (Signed)
Patient ID: Jason [name], male   DOB: 03/04/1981, 35 y.o.   MRN: [id number] Patient ID: Jason [name], male   DOB: 02/18/1981, 35 y.o.   MRN: [id number] Patient ID: Jason [name], male   DOB: 06/05/1981, 35 y.o.   MRN: [id number]  Psychiatric  Adult follow-up  Patient Identification: Jason [name] MRN:  [id number] Date of Evaluation:  07/17/2016 Referral Source: Dr. Selinda FlavinKevin Howard Chief Complaint:   Chief Complaint    Depression; Anxiety; Follow-up     Visit Diagnosis:    ICD-9-CM ICD-10-CM   1. Generalized anxiety disorder 300.02 F41.1     History of Present Illness:  This patient is a 35 year old married white male who lives with his wife in [place]. They have no children. 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  The patient was referred by his primary physician, Dr. Selinda FlavinKevin Howard for further assessment of depression and anxiety.  The patient states that he is always been a little bit on the depressive side and anxious person who is shy. However over the last 7 or 8 months he is become excessively anxious and worried. He wakes up with a feeling of dread and it doesn't go away. He's had little drive motivation or interest in doing anything that he usually enjoys. He's had several stressors. His wife has been very sick and has no energy and may have MS or another autoimmune disorder. Her doctors have not been able to figure out what is wrong. She does very little other than sleep all day.  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 He becomes anxious shy and panicky. His company was bought by a new firm and they are expecting a lot more out of him which is made him very anxious. He states that his eyes have difficulty with social anxiety. But lately he's had trouble even talking to his friends and just "can't come up with anything to say." He feels very awkward and  self-conscious much of the time.  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 He also enjoys painting. He has various projects that he just can't complete. His mind races and he can't decide what to do first. He often feels depressed and sad and feels like crying but can't. His energy is low. He denies suicidal ideation. He eats well and exercises 4-5 times a week. He doesn't use drugs and drinks very little. He sleeps very well but always feels tired. He has been tried on several antidepressants. Zoloft helped but made him very drowsy. Prozac and Celexa did not help and Wellbutrin made him feel wired. He's currently on a low dose of clonazepam but it makes him drowsy as well. He's never had past psychiatric treatment or counseling  The patient returns after 4 weeks. He states he had to stop the trintellix because the increased dose caused nausea. He tried the half dose but it was still making him feel weird and he stopped it. He states he is doing much better on just the BuSpar and clonazepam. His anxiety is lessened and he is having less social phobia. He denies being depressed at the moment. He would like to keep his medications the same  Associated Signs/Symptoms: Depression Symptoms:  depressed mood, anhedonia, psychomotor retardation, fatigue, feelings of worthlessness/guilt, difficulty concentrating, anxiety, loss of energy/fatigue,  Anxiety Symptoms:  Excessive Worry, Panic Symptoms, Social Anxiety,   Past Psychiatric History: none  Previous Psychotropic Medications: Yes   Substance Abuse History in the last 12 months:  No.  Consequences of Substance Abuse: NA  Past Medical History:  Past Medical History:  Diagnosis Date  . Elevated cholesterol    No past surgical history on file.  Family Psychiatric History: none  Family History: No family history on file.  Social History:   Social History   Social  History  . Marital status: Married    Spouse name: N/A  . Number of children: N/A  . Years of education: N/A   Social History Main Topics  . Smoking status: Former Games developermoker  . Smokeless tobacco: Never Used     Comment: Quit 06-2008  . Alcohol use No     Comment: 12-07-15 per pt no  . Drug use: No     Comment: 12-07-2015 per pt no  . Sexual activity: Yes   Other Topics Concern  . None   Social History Narrative  . None    Additional Social History: Patient grew up with both parents and one older sister. He denies any history of trauma or abuse but states his parents are very poor and often had their lights cut off. He was often embarrassed to bring friends home. He was a fairly good Consulting civil engineerstudent but sometimes distracted in school and would rather draw than do his work. 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 He has been married for 7 years but has no children.  Allergies:  No Known Allergies  Metabolic Disorder Labs: No results found for: HGBA1C, MPG No results found for: PROLACTIN No results found for: CHOL, TRIG, HDL, CHOLHDL, VLDL, LDLCALC   Current Medications: Current Outpatient Prescriptions  Medication Sig Dispense Refill  . acyclovir (ZOVIRAX) 200 MG capsule Take 200 mg by mouth 2 (two) times daily. Reported on 01/05/2016  3  . busPIRone (BUSPAR) 10 MG tablet Take 1 tablet (10 mg total) by mouth 3 (three) times daily. 90 tablet 2  . clonazePAM (KLONOPIN) 0.5 MG tablet Take 0.5 tablets (0.25 mg total) by mouth 2 (two) times daily as needed. 60 tablet 2   No current facility-administered medications for this visit.     Neurologic: Headache: No Seizure: No Paresthesias:No  Musculoskeletal: Strength & Muscle Tone: within normal limits Gait & Station: normal Patient leans: N/A  Psychiatric Specialty Exam: Review of Systems   Constitutional: Positive for malaise/fatigue.  Psychiatric/Behavioral: Positive for depression. The patient is nervous/anxious.     Blood pressure 131/87, pulse 72, resp. rate 18, weight 159 lb 3.2 oz (72.2 kg).Body mass index is 24.21 kg/m.  General Appearance: Casual, Neat and Well Groomed  Eye Contact:  Fair  Speech:  Clear and Coherent  Volume:  Decreased  Mood:  Good   Affect:Bright   Thought Process:  Goal Directed  Orientation:  Full (Time, Place, and Person)  Thought Content:  Rumination  Suicidal Thoughts:  No  Homicidal Thoughts:  No  Memory:  Immediate;   Good Recent;   Good Remote;   Good  Judgement:  Good  Insight:  Fair  Psychomotor Activity:  Normal  Concentration:  Poor  Recall:  Good  Fund of Knowledge:Good  Language: Good  Akathisia:  No  Handed:  Right  AIMS (if indicated):  Assets:  Communication Skills Desire for Improvement Physical Health Resilience Social Support Vocational/Educational  ADL's:  Intact  Cognition: WNL  Sleep:  good    Treatment Plan Summary: Medication management   The patient will continueclonazepam 0.25 milligrams up to twice a day as needed. He will continue  Trintellix . He will Continue BuSpar 10 mg 3 times a day. He'll return to see me in 3 months   Adahlia Stembridge, Gavin Pound, MD 11/21/20171:59 PM

## 2016-08-03 ENCOUNTER — Encounter: Payer: Self-pay | Admitting: Cardiovascular Disease

## 2016-10-03 ENCOUNTER — Telehealth (HOSPITAL_COMMUNITY): Payer: Self-pay | Admitting: *Deleted

## 2016-10-03 NOTE — Telephone Encounter (Signed)
Provider out of office on Feb 21.  lmtcb 

## 2016-10-10 ENCOUNTER — Telehealth (HOSPITAL_COMMUNITY): Payer: Self-pay | Admitting: *Deleted

## 2016-10-10 NOTE — Telephone Encounter (Signed)
left voice message, provider out of office 10/17/16. 

## 2016-10-17 ENCOUNTER — Ambulatory Visit (HOSPITAL_COMMUNITY): Payer: BLUE CROSS/BLUE SHIELD | Admitting: Psychiatry

## 2016-10-29 ENCOUNTER — Ambulatory Visit (INDEPENDENT_AMBULATORY_CARE_PROVIDER_SITE_OTHER): Payer: 59 | Admitting: Psychiatry

## 2016-10-29 ENCOUNTER — Encounter (HOSPITAL_COMMUNITY): Payer: Self-pay | Admitting: Psychiatry

## 2016-10-29 VITALS — BP 140/90 | HR 61 | Ht 68.0 in | Wt 152.4 lb

## 2016-10-29 DIAGNOSIS — Z79899 Other long term (current) drug therapy: Secondary | ICD-10-CM | POA: Diagnosis not present

## 2016-10-29 DIAGNOSIS — Z87891 Personal history of nicotine dependence: Secondary | ICD-10-CM | POA: Diagnosis not present

## 2016-10-29 DIAGNOSIS — F321 Major depressive disorder, single episode, moderate: Secondary | ICD-10-CM

## 2016-10-29 MED ORDER — METHYLPHENIDATE HCL 10 MG PO TABS: 10.0000 mg | ORAL_TABLET | Freq: Two times a day (BID) | ORAL | 0 refills | Status: DC

## 2016-10-29 MED ORDER — CLONAZEPAM 0.5 MG PO TABS: 0.2500 mg | ORAL_TABLET | Freq: Two times a day (BID) | ORAL | 2 refills | Status: DC | PRN

## 2016-10-29 NOTE — Progress Notes (Signed)
Patient ID: Jason [name], male   DOB: 1981/02/09, 36 y.o.   MRN: [id number] Patient ID: Jason [name], male   DOB: 19-Apr-1981, 36 y.o.   MRN: [id number] Patient ID: Jason [name], male   DOB: 1980-09-08, 36 y.o.   MRN: [id number]  Psychiatric  Adult follow-up  Patient Identification: Jason [name] MRN:  [id number] Date of Evaluation:  10/29/2016 Referral Source: Dr. Selinda Flavin Chief Complaint:   Chief Complaint    Depression; Anxiety; Follow-up     Visit Diagnosis:    ICD-9-CM ICD-10-CM   1. Moderate single current episode of major depressive disorder (HCC) 296.22 F32.1 Vitamin D 1,25 dihydroxy    History of Present Illness:  This patient is a 36 year old married white male who lives with his wife in [place]. They have no children. He is a Tax adviser for [employer].  The patient was referred by his primary physician, Dr. Selinda Flavin for further assessment of depression and anxiety.  The patient states that he is always been a little bit on the depressive side and anxious person who is shy. However over the last 7 or 8 months he is become excessively anxious and worried. He wakes up with a feeling of dread and it doesn't go away. He's had little drive motivation or interest in doing anything that he usually enjoys. He's had several stressors. His wife has been very sick and has no energy and may have MS or another autoimmune disorder. Her doctors have not been able to figure out what is wrong. She does very little other than sleep all day.  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 He becomes anxious shy and panicky. His company was bought by a new firm and they are expecting a lot more out of him which is made him very anxious. He states that his eyes have difficulty with social anxiety. But lately he's had trouble even talking to his friends and just "can't  come up with anything to say." He feels very awkward and self-conscious much of the time.  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 He also enjoys painting. He has various projects that he just can't complete. His mind races and he can't decide what to do first. He often feels depressed and sad and feels like crying but can't. His energy is low. He denies suicidal ideation. He eats well and exercises 4-5 times a week. He doesn't use drugs and drinks very little. He sleeps very well but always feels tired. He has been tried on several antidepressants. Zoloft helped but made him very drowsy. Prozac and Celexa did not help and Wellbutrin made him feel wired. He's currently on a low dose of clonazepam but it makes him drowsy as well. He's never had past psychiatric treatment or counseling  The patient returns after 3 months. 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 He is working about 60 hours a week. He stopped the BuSpar because it made him fuzzy headed. He continues on clonazepam just at bedtime. He recently had labs done by his primary doctor and everything was normal but he feels tired all the time and wonders if he could try a low-dose stimulant. He has tried numerous antidepressants without improvement. I also suggested we check his vitamin D level and he  agrees  Associated Signs/Symptoms: Depression Symptoms:  depressed mood, anhedonia, psychomotor retardation, fatigue, feelings of worthlessness/guilt, difficulty concentrating, anxiety, loss of energy/fatigue,  Anxiety Symptoms:  Excessive Worry, Panic Symptoms, Social Anxiety,   Past Psychiatric History: none  Previous Psychotropic Medications: Yes   Substance Abuse History in the last 12 months:  No.  Consequences of Substance Abuse: NA  Past Medical History:  Past Medical History:  Diagnosis Date  . Elevated cholesterol    No past surgical  history on file.  Family Psychiatric History: none  Family History: No family history on file.  Social History:   Social History   Social History  . Marital status: Married    Spouse name: N/A  . Number of children: N/A  . Years of education: N/A   Social History Main Topics  . Smoking status: Former Games developermoker  . Smokeless tobacco: Never Used     Comment: Quit 06-2008  . Alcohol use No     Comment: 12-07-15 per pt no  . Drug use: No     Comment: 12-07-2015 per pt no  . Sexual activity: Yes   Other Topics Concern  . None   Social History Narrative  . None    Additional Social History: Patient grew up with both parents and one older sister. He denies any history of trauma or abuse but states his parents are very poor and often had their lights cut off. He was often embarrassed to bring friends home. He was a fairly good Consulting civil engineerstudent but sometimes distracted in school and would rather draw than do his work. 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 He has been married for 7 years but has no children.  Allergies:  No Known Allergies  Metabolic Disorder Labs: No results found for: HGBA1C, MPG No results found for: PROLACTIN No results found for: CHOL, TRIG, HDL, CHOLHDL, VLDL, LDLCALC   Current Medications: Current Outpatient Prescriptions  Medication Sig Dispense Refill  . acyclovir (ZOVIRAX) 200 MG capsule Take 200 mg by mouth 2 (two) times daily. Reported on 01/05/2016  3  . clonazePAM (KLONOPIN) 0.5 MG tablet Take 0.5 tablets (0.25 mg total) by mouth 2 (two) times daily as needed. 60 tablet 2  . methylphenidate (RITALIN) 10 MG tablet Take 1 tablet (10 mg total) by mouth 2 (two) times daily with breakfast and lunch. 60 tablet 0   No current facility-administered medications for this visit.     Neurologic: Headache: No Seizure:  No Paresthesias:No  Musculoskeletal: Strength & Muscle Tone: within normal limits Gait & Station: normal Patient leans: N/A  Psychiatric Specialty Exam: Review of Systems  Constitutional: Positive for malaise/fatigue.  Psychiatric/Behavioral: Positive for depression. The patient is nervous/anxious.     Blood pressure 140/90, pulse 61, height 5\' 8"  (1.727 m), weight 152 lb 6.4 oz (69.1 kg), SpO2 99 %.Body mass index is 23.17 kg/m.  General Appearance: Casual, Neat and Well Groomed  Eye Contact:  Fair  Speech:  Clear and Coherent  Volume:  Decreased  Mood: Okay states he is depressed at times   Affect:Tired   Thought Process:  Goal Directed  Orientation:  Full (Time, Place, and Person)  Thought Content:  Rumination  Suicidal Thoughts:  No  Homicidal Thoughts:  No  Memory:  Immediate;   Good Recent;   Good Remote;   Good  Judgement:  Good  Insight:  Fair  Psychomotor Activity:  Normal  Concentration:  Poor  Recall:  Good  Fund of Knowledge:Good  Language: Good  Akathisia:  No  Handed:  Right  AIMS (if indicated):    Assets:  Communication Skills Desire for Improvement Physical Health Resilience Social Support Vocational/Educational  ADL's:  Intact  Cognition: WNL  Sleep:  good    Treatment Plan Summary: Medication management   The patient will continueclonazepam 0.25 milligrams up to twice a day as needed. He will continue Start methylphenidate 10 mg twice a day. We will check his vitamin D level and he'll return to see me in 4 weeks    [name], MD 3/5/20188:44 AM

## 2016-11-02 LAB — VITAMIN D 1,25 DIHYDROXY
VITAMIN D3 1, 25 (OH): 52 pg/mL
Vitamin D 1, 25 (OH)2 Total: 52 pg/mL (ref 18–72)

## 2016-11-06 DIAGNOSIS — R002 Palpitations: Secondary | ICD-10-CM | POA: Diagnosis not present

## 2016-11-06 DIAGNOSIS — Z6822 Body mass index (BMI) 22.0-22.9, adult: Secondary | ICD-10-CM | POA: Diagnosis not present

## 2016-11-26 ENCOUNTER — Ambulatory Visit (HOSPITAL_COMMUNITY): Payer: 59 | Admitting: Psychiatry

## 2016-11-28 ENCOUNTER — Encounter: Payer: Self-pay | Admitting: *Deleted

## 2016-11-29 ENCOUNTER — Encounter: Payer: Self-pay | Admitting: Cardiovascular Disease

## 2016-11-29 ENCOUNTER — Telehealth: Payer: Self-pay | Admitting: Cardiovascular Disease

## 2016-11-29 ENCOUNTER — Ambulatory Visit (INDEPENDENT_AMBULATORY_CARE_PROVIDER_SITE_OTHER): Payer: 59 | Admitting: Cardiovascular Disease

## 2016-11-29 VITALS — BP 148/96 | HR 73 | Ht 68.0 in | Wt 159.0 lb

## 2016-11-29 DIAGNOSIS — R002 Palpitations: Secondary | ICD-10-CM

## 2016-11-29 DIAGNOSIS — R079 Chest pain, unspecified: Secondary | ICD-10-CM | POA: Diagnosis not present

## 2016-11-29 DIAGNOSIS — R030 Elevated blood-pressure reading, without diagnosis of hypertension: Secondary | ICD-10-CM | POA: Diagnosis not present

## 2016-11-29 NOTE — Patient Instructions (Addendum)
Medication Instructions:  Continue all current medications.  Labwork: none  Testing/Procedures:  Your physician has requested that you have an echocardiogram. Echocardiography is a painless test that uses sound waves to create images of your heart. It provides your doctor with information about the size and shape of your heart and how well your heart's chambers and valves are working. This procedure takes approximately one hour. There are no restrictions for this procedure.  Your physician has recommended that you wear a 7 day event monitor. Event monitors are medical devices that record the heart's electrical activity. Doctors most often [place] these monitors to diagnose arrhythmias. Arrhythmias are problems with the speed or rhythm of the heartbeat. The monitor is a small, portable device. You can wear one while you do your normal daily activities. This is usually used to diagnose what is causing palpitations/syncope (passing out).  Office will contact with results via phone or letter.    Follow-Up: 6 weeks   Any Other Special Instructions Will Be Listed Below (If Applicable). Your physician has requested that you regularly monitor and record your blood pressure readings at home. Please take readings up to 4 x per week & bring log back with you to next visit for MD review.      If you need a refill on your cardiac medications before your next appointment, please call your pharmacy.

## 2016-11-29 NOTE — Progress Notes (Signed)
CARDIOLOGY CONSULT NOTE  Patient ID: Jason [name] MRN: [id number] DOB/AGE: 1981-07-11 36 y.o.  Admit date: (Not on file) Primary Physician: Selinda Flavin, MD Referring Physician: Dimas Aguas  Reason for Consultation: chest pain, palpitations, dizziness  HPI: Jason [name] is a 36 y.o. male who is being seen today for the evaluation of chest pain, palpitations, and dizziness at the request of Selinda Flavin, MD.   I reviewed all relevant office documentation. His PCP sent me a letter as well. The patient is apparently very anxious and is described as having atypical chest pains with palpitations and dizziness. He reports that the patient had a stress test several years ago which was negative. He reports ECGs have been negative. There have been more recent complaints of exertional palpitations and the EKG was normal. Thyroid testing in December 2017 was reportedly normal. Dr. Dimas Aguas wonders if he would be a good candidate for an event monitor.  ECG performed on 11/06/16 which I personally interpreted demonstrated normal sinus rhythm with no ischemic ST segment or T-wave abnormalities, nor any arrhythmias.  Vit D levels normal on 10/29/16.  I reviewed all labs performed on 08/03/16. Sodium, potassium, renal function, white blood cells, hemoglobin, platelets, and TSH were all normal. Total cholesterol mildly elevated at 207, LDL 128.  He says he had palpitations even back in high school and wore a monitor. He said they can occur at rest and when lifting objects greater than 10 pounds. He has associated retrosternal and precordial chest pain with radiation into the left side of the neck. Described as sharp. He also has episodic fatigue related to this. He unloads trucks for a living and on one occasion, he had chest pain, palpitations, and nausea. He also has associated lightheadedness and dizziness but denies syncope.  He has a history of anxiety but denies panic attacks. He has  been on Klonopin for years.  He has seasonal allergies controlled with Claritin.  Blood pressure 148/96 today. He said it is also been elevated at home as he has a monitor with systolic readings greater than 140.  He is here with his mother.   No Known Allergies  Current Outpatient Prescriptions  Medication Sig Dispense Refill  . acyclovir (ZOVIRAX) 200 MG capsule Take 200 mg by mouth 2 (two) times daily. Reported on 01/05/2016  3  . clonazePAM (KLONOPIN) 0.5 MG tablet Take 0.5 tablets (0.25 mg total) by mouth 2 (two) times daily as needed. 60 tablet 2  . methylphenidate (RITALIN) 10 MG tablet Take 1 tablet (10 mg total) by mouth 2 (two) times daily with breakfast and lunch. 60 tablet 0   No current facility-administered medications for this visit.     Past Medical History:  Diagnosis Date  . Anxiety disorder   . Elevated cholesterol   . Major depressive disorder     No past surgical history on file.  Social History   Social History  . Marital status: Married    Spouse name: N/A  . Number of children: N/A  . Years of education: N/A   Occupational History  . Not on file.   Social History Main Topics  . Smoking status: Former Games developer  . Smokeless tobacco: Never Used     Comment: Quit 06-2008  . Alcohol use No     Comment: 12-07-15 per pt no  . Drug use: No     Comment: 12-07-2015 per pt no  . Sexual activity: Yes   Other  Topics Concern  . Not on file   Social History Narrative  . No narrative on file     No family history of premature CAD in 1st degree relatives.  Current Meds  Medication Sig  . acyclovir (ZOVIRAX) 200 MG capsule Take 200 mg by mouth 2 (two) times daily. Reported on 01/05/2016  . clonazePAM (KLONOPIN) 0.5 MG tablet Take 0.5 tablets (0.25 mg total) by mouth 2 (two) times daily as needed.  . methylphenidate (RITALIN) 10 MG tablet Take 1 tablet (10 mg total) by mouth 2 (two) times daily with breakfast and lunch.      Review of systems  complete and found to be negative unless listed above in HPI    Physical exam Height  (1.727 m), weight 159 lb (72.1 kg). General: NAD Neck: No JVD, no thyromegaly or thyroid nodule.  Lungs: Clear to auscultation bilaterally with normal respiratory effort. CV: Nondisplaced PMI. Regular rate and rhythm, normal S1/S2, no S3/S4, no murmur.  No peripheral edema.  No carotid bruit.  Normal pedal pulses.  Abdomen: Soft, nontender, no hepatosplenomegaly, no distention.  Skin: Intact without lesions or rashes.  Neurologic: Alert and oriented x 3.  Psych: Normal affect. Extremities: No clubbing or cyanosis.  HEENT: Normal.   ECG: Most recent ECG reviewed.   Labs: No results found for: K, BUN, CREATININE, ALT, TSH, HGB   Lipids: No results found for: LDLCALC, LDLDIRECT, CHOL, TRIG, HDL      ASSESSMENT AND PLAN:  1. Palpitations: Palpitations reportedly occur suddenly with no warmup phase. He may have inappropriate sinus tachycardia or paroxysmal supraventricular tachycardia. I will obtain a one-week event monitor. I will order a 2-D echocardiogram with Doppler to evaluate cardiac structure, function, and regional wall motion.  2. Chest pain: Atypical symptoms for ischemic heart disease. I will order a 2-D echocardiogram with Doppler to evaluate cardiac structure, function, and regional wall motion.  3. Elevated BP: I will monitor this to see if antihypertensive therapy is warranted. I have asked him to check his blood pressure up to 4 times per week for the next 4-6 weeks.   Dispo: fu 4-6 weeks.   Signed: Prentice Docker, M.D., F.A.C.C.  11/29/2016, 1:30 PM

## 2016-11-29 NOTE — Telephone Encounter (Signed)
Pre-cert Verification for the following procedure   ECHO scheduled for 12/12/16 at [facility]

## 2016-12-05 ENCOUNTER — Ambulatory Visit (HOSPITAL_COMMUNITY): Payer: 59 | Admitting: Psychiatry

## 2016-12-06 ENCOUNTER — Ambulatory Visit (INDEPENDENT_AMBULATORY_CARE_PROVIDER_SITE_OTHER): Payer: 59 | Admitting: Psychiatry

## 2016-12-06 ENCOUNTER — Encounter (HOSPITAL_COMMUNITY): Payer: Self-pay | Admitting: Psychiatry

## 2016-12-06 VITALS — BP 122/93 | HR 71 | Ht 68.0 in | Wt 146.0 lb

## 2016-12-06 DIAGNOSIS — F321 Major depressive disorder, single episode, moderate: Secondary | ICD-10-CM | POA: Diagnosis not present

## 2016-12-06 DIAGNOSIS — Z87891 Personal history of nicotine dependence: Secondary | ICD-10-CM

## 2016-12-06 DIAGNOSIS — Z79899 Other long term (current) drug therapy: Secondary | ICD-10-CM | POA: Diagnosis not present

## 2016-12-06 MED ORDER — METHYLPHENIDATE HCL 10 MG PO TABS: 10.0000 mg | ORAL_TABLET | Freq: Two times a day (BID) | ORAL | 0 refills | Status: DC

## 2016-12-06 MED ORDER — FLUOXETINE HCL 20 MG PO CAPS: 20.0000 mg | ORAL_CAPSULE | Freq: Every day | ORAL | 2 refills | Status: DC

## 2016-12-06 NOTE — Progress Notes (Signed)
Patient ID: Jason [name], male   DOB: 1981-01-22, 36 y.o.   MRN: [id number] Patient ID: Jason [name], male   DOB: Dec 14, 1980, 36 y.o.   MRN: [id number] Patient ID: Jason [name], male   DOB: Mar 15, 1981, 36 y.o.   MRN: [id number]  Psychiatric  Adult follow-up  Patient Identification: Jason [name] MRN:  [id number] Date of Evaluation:  12/06/2016 Referral Source: Dr. Selinda Flavin Chief Complaint:   Chief Complaint    Depression; Anxiety; ADD; Follow-up     Visit Diagnosis:    ICD-9-CM ICD-10-CM   1. Moderate single current episode of major depressive disorder (HCC) 296.22 F32.1     History of Present Illness:  This patient is a 36 year old married white male who lives with his wife in [place]. They have no children. He is a Tax adviser for [employer].  The patient was referred by his primary physician, Dr. Selinda Flavin for further assessment of depression and anxiety.  The patient states that he is always been a little bit on the depressive side and anxious person who is shy. However over the last 7 or 8 months he is become excessively anxious and worried. He wakes up with a feeling of dread and it doesn't go away. He's had little drive motivation or interest in doing anything that he usually enjoys. He's had several stressors. His wife has been very sick and has no energy and may have MS or another autoimmune disorder. Her doctors have not been able to figure out what is wrong. She does very little other than sleep all day.  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 He becomes anxious shy and panicky. His company was bought by a new firm and they are expecting a lot more out of him which is made him very anxious. He states that his eyes have difficulty with social anxiety. But lately he's had trouble even talking to his friends and just "can't come up with anything  to say." He feels very awkward and self-conscious much of the time.  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 He also enjoys painting. He has various projects that he just can't complete. His mind races and he can't decide what to do first. He often feels depressed and sad and feels like crying but can't. His energy is low. He denies suicidal ideation. He eats well and exercises 4-5 times a week. He doesn't use drugs and drinks very little. He sleeps very well but always feels tired. He has been tried on several antidepressants. Zoloft helped but made him very drowsy. Prozac and Celexa did not help and Wellbutrin made him feel wired. He's currently on a low dose of clonazepam but it makes him drowsy as well. He's never had past psychiatric treatment or counseling  The patient returns after 4 weeks. Last time he was having a lot of trouble focusing and we added methylphenidate which does seem to help with focus. However he states he is still depressed. He does never any good reason to be. For the most part he likes his job although it can be stressful since he is dealing with hazardous materials. We went back through the numerous medicines he has tried for depression. He states that when he was in his early 99s he tried Prozac but didn't stay on  it very long and didn't "give it a chance." He would like to retry it at this point and I think this is a reasonable idea  Associated Signs/Symptoms: Depression Symptoms:  depressed mood, anhedonia, psychomotor retardation, fatigue, feelings of worthlessness/guilt, difficulty concentrating, anxiety, loss of energy/fatigue,  Anxiety Symptoms:  Excessive Worry, Panic Symptoms, Social Anxiety,   Past Psychiatric History: none  Previous Psychotropic Medications: Yes   Substance Abuse History in the last 12 months:  No.  Consequences of Substance Abuse: NA  Past Medical History:  Past  Medical History:  Diagnosis Date  . Anxiety disorder   . Elevated cholesterol   . Major depressive disorder    No past surgical history on file.  Family Psychiatric History: none  Family History:  Family History  Problem Relation Age of Onset  . Hypertension Mother   . Hyperlipidemia Mother   . Hypertension Father   . Heart attack Paternal Grandfather     Social History:   Social History   Social History  . Marital status: Married    Spouse name: N/A  . Number of children: N/A  . Years of education: N/A   Social History Main Topics  . Smoking status: Former Games developer  . Smokeless tobacco: Never Used     Comment: Quit 06-2008  . Alcohol use No     Comment: 12-07-15 per pt no  . Drug use: No     Comment: 12-07-2015 per pt no  . Sexual activity: Yes   Other Topics Concern  . None   Social History Narrative  . None    Additional Social History: Patient grew up with both parents and one older sister. He denies any history of trauma or abuse but states his parents are very poor and often had their lights cut off. He was often embarrassed to bring friends home. 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 He has been married for 7 years but has no children.  Allergies:  No Known Allergies  Metabolic Disorder Labs: No results found for: HGBA1C, MPG No results found for: PROLACTIN No results found for: CHOL, TRIG, HDL, CHOLHDL, VLDL, LDLCALC   Current Medications: Current Outpatient Prescriptions  Medication Sig Dispense Refill  . acyclovir (ZOVIRAX) 200 MG capsule Take 200 mg by mouth 2 (two) times daily. Reported on 01/05/2016  3  . clonazePAM (KLONOPIN) 0.5 MG tablet Take 0.5 tablets (0.25 mg total) by mouth 2 (two) times daily as needed. 60  tablet 2  . methylphenidate (RITALIN) 10 MG tablet Take 1 tablet (10 mg total) by mouth 2 (two) times daily with breakfast and lunch. 60 tablet 0  . FLUoxetine (PROZAC) 20 MG capsule Take 1 capsule (20 mg total) by mouth daily. 30 capsule 2  . methylphenidate (RITALIN) 10 MG tablet Take 1 tablet (10 mg total) by mouth 2 (two) times daily with breakfast and lunch. 60 tablet 0   No current facility-administered medications for this visit.     Neurologic: Headache: No Seizure: No Paresthesias:No  Musculoskeletal: Strength & Muscle Tone: within normal limits Gait & Station: normal Patient leans: N/A  Psychiatric Specialty Exam: Review of Systems  Constitutional: Positive for malaise/fatigue.  Psychiatric/Behavioral: Positive for depression. The patient is nervous/anxious.  Blood pressure (!) 122/93, pulse 71, height  (1.727 m), weight 146 lb (66.2 kg).Body mass index is 22.2 kg/m.  General Appearance: Casual, Neat and Well Groomed  Eye Contact:  Fair  Speech:  Clear and Coherent  Volume:  Decreased  Mood: Okay states he is depressed at times   Affect:A little constricted   Thought Process:  Goal Directed  Orientation:  Full (Time, Place, and Person)  Thought Content:  Rumination  Suicidal Thoughts:  No  Homicidal Thoughts:  No  Memory:  Immediate;   Good Recent;   Good Remote;   Good  Judgement:  Good  Insight:  Fair  Psychomotor Activity:  Normal  Concentration:  Poor  Recall:  Good  Fund of Knowledge:Good  Language: Good  Akathisia:  No  Handed:  Right  AIMS (if indicated):    Assets:  Communication Skills Desire for Improvement Physical Health Resilience Social Support Vocational/Educational  ADL's:  Intact  Cognition: WNL  Sleep:  good    Treatment Plan Summary: Medication management   The patient will continueclonazepam 0.25 milligrams up to twice a day as needed. He will continue  methylphenidate 10 mg twice a day. He will start Prozac 20 mg  daily and return to see me in 6 weeks   [name], MD 4/12/201811:59 AM

## 2016-12-12 ENCOUNTER — Other Ambulatory Visit: Payer: Self-pay

## 2016-12-12 ENCOUNTER — Ambulatory Visit (INDEPENDENT_AMBULATORY_CARE_PROVIDER_SITE_OTHER): Payer: 59

## 2016-12-12 DIAGNOSIS — R079 Chest pain, unspecified: Secondary | ICD-10-CM

## 2016-12-12 DIAGNOSIS — R002 Palpitations: Secondary | ICD-10-CM

## 2016-12-13 ENCOUNTER — Ambulatory Visit (INDEPENDENT_AMBULATORY_CARE_PROVIDER_SITE_OTHER): Payer: 59

## 2016-12-13 DIAGNOSIS — R079 Chest pain, unspecified: Secondary | ICD-10-CM

## 2016-12-13 DIAGNOSIS — R002 Palpitations: Secondary | ICD-10-CM | POA: Diagnosis not present

## 2016-12-13 LAB — ECHOCARDIOGRAM COMPLETE
AVLVOTPG: 6 mmHg
CHL CUP STROKE VOLUME: 50 mL
E decel time: 261 msec
E/e' ratio: 5.17
FS: 25 % — AB (ref 28–44)
IV/PV OW: 0.78
LA diam end sys: 29 mm
LA diam index: 1.62 cm/m2
LA vol A4C: 18.1 ml
LA vol: 21.6 mL
LASIZE: 29 mm
LAVOLIN: 12.1 mL/m2
LDCA: 2.84 cm2
LV E/e'average: 5.17
LV dias vol: 82 mL (ref 62–150)
LV e' LATERAL: 18.7 cm/s
LV sys vol: 32 mL (ref 21–61)
LVDIAVOLIN: 46 mL/m2
LVEEMED: 5.17
LVOT SV: 63 mL
LVOT VTI: 22.3 cm
LVOT diameter: 19 mm
LVOTPV: 120 cm/s
LVSYSVOLIN: 18 mL/m2
Lateral S' vel: 13.3 cm/s
MV Dec: 261
MV Peak grad: 4 mmHg
MV pk A vel: 55.5 m/s
MV pk E vel: 96.7 m/s
PV Reg vel dias: 81.9 cm/s
PW: 8.66 mm — AB (ref 0.6–1.1)
RV TAPSE: 22.7 mm
Simpson's disk: 60
TDI e' lateral: 18.7
TDI e' medial: 12

## 2016-12-18 ENCOUNTER — Telehealth: Payer: Self-pay | Admitting: *Deleted

## 2016-12-18 NOTE — Telephone Encounter (Signed)
Notes recorded by Lesle Chris, LPN on 1/61/0960 at 2:55 PM EDT Patient notified via voice mail. Copy to pmd.  Follow up scheduled for 01/10/2017 with Dr. Purvis Sheffield.   ------  Notes recorded by Laqueta Linden, MD on 12/13/2016 at 12:47 PM EDT Normal.

## 2016-12-27 ENCOUNTER — Telehealth: Payer: Self-pay | Admitting: *Deleted

## 2016-12-27 NOTE — Telephone Encounter (Signed)
Notes recorded by Lesle ChrisAngela G Hill, LPN on 1/6/10965/10/2016 at 9:17 AM EDT Patient notified via voice mail. Copy to pmd. Follow up scheduled 01/10/2017 with Dr. Purvis SheffieldKoneswaran. ------  Notes recorded by Laqueta LindenSuresh A Koneswaran, MD on 12/26/2016 at 4:42 PM EDT No significant abnormal heart rhythms.

## 2017-01-10 ENCOUNTER — Other Ambulatory Visit: Payer: Self-pay | Admitting: *Deleted

## 2017-01-10 ENCOUNTER — Ambulatory Visit (INDEPENDENT_AMBULATORY_CARE_PROVIDER_SITE_OTHER): Payer: 59 | Admitting: Cardiovascular Disease

## 2017-01-10 ENCOUNTER — Encounter: Payer: Self-pay | Admitting: Cardiovascular Disease

## 2017-01-10 VITALS — BP 120/92 | HR 66 | Ht 65.0 in | Wt 145.0 lb

## 2017-01-10 DIAGNOSIS — R079 Chest pain, unspecified: Secondary | ICD-10-CM | POA: Diagnosis not present

## 2017-01-10 DIAGNOSIS — R002 Palpitations: Secondary | ICD-10-CM

## 2017-01-10 DIAGNOSIS — I10 Essential (primary) hypertension: Secondary | ICD-10-CM

## 2017-01-10 MED ORDER — LOSARTAN POTASSIUM 25 MG PO TABS: 25.0000 mg | ORAL_TABLET | Freq: Every day | ORAL | 6 refills | Status: DC

## 2017-01-10 NOTE — Patient Instructions (Addendum)
Medication Instructions:   Begin Losartan 25mg  daily.  Continue all other medications.    Labwork:  BMET - order given today.  Can do next week.   Office will contact with results via phone or letter.    Testing/Procedures: none  Follow-Up: As needed for Cardiology.   Any Other Special Instructions Will Be Listed Below (If Applicable). Primary MD to manage hypertension going forward.   If you need a refill on your cardiac medications before your next appointment, please call your pharmacy.

## 2017-01-10 NOTE — Addendum Note (Signed)
Addended by: Lesle ChrisHILL, ANGELA G on: 01/10/2017 03:30 PM   Modules accepted: Orders

## 2017-01-10 NOTE — Progress Notes (Signed)
SUBJECTIVE: The patient returns for follow-up after undergoing cardiovascular testing performed for the evaluation of palpitations and chest pain.  Echocardiogram was normal on 12/12/16, LVEF 55-60%.  Event monitoring demonstrated sinus rhythm with some episodes of sinus tachycardia. Symptoms primarily correlated with sinus rhythm with some symptoms also correlated with sinus tachycardia. There were no significant arrhythmias.  He has been on fluoxetine 20 mg and he feels like his symptoms have improved. He denies chest pain and palpitations.   He has been maintaining a blood pressure log which I personally reviewed. He has several systolic readings greater than 140 and diastolic readings greater than 90. When his blood pressure is high he feels hot, flushed, and fatigue.   Review of Systems: As per "subjective", otherwise negative.  No Known Allergies  Current Outpatient Prescriptions  Medication Sig Dispense Refill  . acyclovir (ZOVIRAX) 200 MG capsule Take 200 mg by mouth 2 (two) times daily. Reported on 01/05/2016  3  . clonazePAM (KLONOPIN) 0.5 MG tablet Take 0.5 tablets (0.25 mg total) by mouth 2 (two) times daily as needed. 60 tablet 2  . FLUoxetine (PROZAC) 20 MG capsule Take 1 capsule (20 mg total) by mouth daily. 30 capsule 2  . methylphenidate (RITALIN) 10 MG tablet Take 1 tablet (10 mg total) by mouth 2 (two) times daily with breakfast and lunch. 60 tablet 0   No current facility-administered medications for this visit.     Past Medical History:  Diagnosis Date  . Anxiety disorder   . Elevated cholesterol   . Major depressive disorder     No past surgical history on file.  Social History   Social History  . Marital status: Married    Spouse name: N/A  . Number of children: N/A  . Years of education: N/A   Occupational History  . Not on file.   Social History Main Topics  . Smoking status: Former Games developer  . Smokeless tobacco: Never Used     Comment:  Quit 06-2008  . Alcohol use No     Comment: 12-07-15 per pt no  . Drug use: No     Comment: 12-07-2015 per pt no  . Sexual activity: Yes   Other Topics Concern  . Not on file   Social History Narrative  . No narrative on file     Vitals:   01/10/17 1440  BP: (!) 120/92  Pulse: 66  SpO2: 99%  Weight: 145 lb (65.8 kg)  Height: 5\' 5"  (1.651 m)    Wt Readings from Last 3 Encounters:  01/10/17 145 lb (65.8 kg)  11/29/16 159 lb (72.1 kg)     PHYSICAL EXAM General: NAD HEENT: Normal. Neck: No JVD, no thyromegaly. Lungs: Clear to auscultation bilaterally with normal respiratory effort. CV: Nondisplaced PMI.  Regular rate and rhythm, normal S1/S2, no S3/S4, no murmur. No pretibial or periankle edema.  No carotid bruit.   Abdomen: Soft, nontender, no distention.  Neurologic: Alert and oriented.  Psych: Normal affect. Skin: Normal. Musculoskeletal: No gross deformities.    ECG: Most recent ECG reviewed.   Labs: No results found for: K, BUN, CREATININE, ALT, TSH, HGB   Lipids: No results found for: LDLCALC, LDLDIRECT, CHOL, TRIG, HDL     ASSESSMENT AND PLAN: 1. Palpitations:  no arrhythmias with event monitoring as detailed above. Episodic sinus tachycardia. Symptoms have improved with institution of fluoxetine by PCP. Echocardiogram was normal as detailed above.  2. Chest pain: No further symptoms. Echocardiogram was normal  as detailed above.  3. Essential HTN: I personally reviewed his blood pressure log as detailed above. Several readings have been greater than 140/90. I will start losartan 25 mg and check a basic metabolic panel to assess potassium and renal function next week. This can be managed by his PCP.    Disposition: Follow up prn  Prentice DockerSuresh Moe Brier, M.D., F.A.C.C.

## 2017-01-17 ENCOUNTER — Encounter (HOSPITAL_COMMUNITY): Payer: Self-pay | Admitting: Psychiatry

## 2017-01-17 ENCOUNTER — Ambulatory Visit (INDEPENDENT_AMBULATORY_CARE_PROVIDER_SITE_OTHER): Payer: 59 | Admitting: Psychiatry

## 2017-01-17 ENCOUNTER — Telehealth (HOSPITAL_COMMUNITY): Payer: Self-pay | Admitting: *Deleted

## 2017-01-17 VITALS — BP 132/88 | HR 63 | Ht 65.0 in | Wt 143.2 lb

## 2017-01-17 DIAGNOSIS — Z87891 Personal history of nicotine dependence: Secondary | ICD-10-CM

## 2017-01-17 DIAGNOSIS — F321 Major depressive disorder, single episode, moderate: Secondary | ICD-10-CM | POA: Diagnosis not present

## 2017-01-17 DIAGNOSIS — F411 Generalized anxiety disorder: Secondary | ICD-10-CM

## 2017-01-17 MED ORDER — METHYLPHENIDATE HCL ER (OSM) 36 MG PO TBCR: 36.0000 mg | EXTENDED_RELEASE_TABLET | Freq: Every day | ORAL | 0 refills | Status: DC

## 2017-01-17 MED ORDER — CLONAZEPAM 0.5 MG PO TABS: 0.2500 mg | ORAL_TABLET | Freq: Two times a day (BID) | ORAL | 2 refills | Status: DC | PRN

## 2017-01-17 NOTE — Progress Notes (Signed)
Patient ID: Jason [name], male   DOB: 06-Jan-1981, 36 y.o.   MRN: [id number] Patient ID: Jason [name], male   DOB: 01/27/1981, 36 y.o.   MRN: [id number] Patient ID: Jason [name], male   DOB: 1981/07/29, 36 y.o.   MRN: [id number]  Psychiatric  Adult follow-up  Patient Identification: Jason [name] MRN:  [id number] Date of Evaluation:  01/17/2017 Referral Source: Dr. Selinda Flavin Chief Complaint:   Chief Complaint    Anxiety; Depression; ADD; Follow-up     Visit Diagnosis:    ICD-9-CM ICD-10-CM   1. Moderate single current episode of major depressive disorder (HCC) 296.22 F32.1   2. Generalized anxiety disorder 300.02 F41.1     History of Present Illness:  This patient is a 36 year old married white male who lives with his wife in [place]. They have no children. He is a Tax adviser for [employer].  The patient was referred by his primary physician, Dr. Selinda Flavin for further assessment of depression and anxiety.  The patient states that he is always been a little bit on the depressive side and anxious person who is shy. However over the last 7 or 8 months he is become excessively anxious and worried. He wakes up with a feeling of dread and it doesn't go away. He's had little drive motivation or interest in doing anything that he usually enjoys. He's had several stressors. His wife has been very sick and has no energy and may have MS or another autoimmune disorder. Her doctors have not been able to figure out what is wrong. She does very little other than sleep all day.  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 He becomes anxious shy and panicky. His company was bought by a new firm and they are expecting a lot more out of him which is made him very anxious. He states that his eyes have difficulty with social anxiety. But lately he's had trouble even talking to  his friends and just "can't come up with anything to say." He feels very awkward and self-conscious much of the time.  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 He also enjoys painting. He has various projects that he just can't complete. His mind races and he can't decide what to do first. He often feels depressed and sad and feels like crying but can't. His energy is low. He denies suicidal ideation. He eats well and exercises 4-5 times a week. He doesn't use drugs and drinks very little. He sleeps very well but always feels tired. He has been tried on several antidepressants. Zoloft helped but made him very drowsy. Prozac and Celexa did not help and Wellbutrin made him feel wired. He's currently on a low dose of clonazepam but it makes him drowsy as well. He's never had past psychiatric treatment or counseling  The patient returns after 4 weeks. Last time he reported feeling more depressed but without any really good reason for it. We started Prozac 20 mg which has helped his mood and he is also tolerating it without difficulty or side effect. He now is working day shift so he is sleeping better at night. 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 He has lost about 20 pounds doing this. He states the methylphenidate helps his focus but it's only  lasting a couple of hours and he forgets to take the second pill. I told him we would do better to switch to Concerta which is longer acting methylphenidate  Associated Signs/Symptoms: Depression Symptoms:  depressed mood, anhedonia, psychomotor retardation, fatigue, feelings of worthlessness/guilt, difficulty concentrating, anxiety, loss of energy/fatigue,  Anxiety Symptoms:  Excessive Worry, Panic Symptoms, Social Anxiety,   Past Psychiatric History: none  Previous Psychotropic Medications: Yes   Substance Abuse History in the last 12 months:  No.  Consequences of Substance  Abuse: NA  Past Medical History:  Past Medical History:  Diagnosis Date  . Anxiety disorder   . Elevated cholesterol   . Major depressive disorder    No past surgical history on file.  Family Psychiatric History: none  Family History:  Family History  Problem Relation Age of Onset  . Hypertension Mother   . Hyperlipidemia Mother   . Hypertension Father   . Heart attack Paternal Grandfather     Social History:   Social History   Social History  . Marital status: Married    Spouse name: N/A  . Number of children: N/A  . Years of education: N/A   Social History Main Topics  . Smoking status: Former Games developermoker  . Smokeless tobacco: Never Used     Comment: Quit 06-2008  . Alcohol use No     Comment: 12-07-15 per pt no  . Drug use: No     Comment: 12-07-2015 per pt no  . Sexual activity: Yes   Other Topics Concern  . None   Social History Narrative  . None    Additional Social History: Patient grew up with both parents and one older sister. He denies any history of trauma or abuse but states his parents are very poor and often had their lights cut off. He was often embarrassed to bring friends home. He was a fairly good Consulting civil engineerstudent but sometimes distracted in school and would rather draw than do his work. 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 He has been married for 7 years but has no children.  Allergies:  No Known Allergies  Metabolic Disorder Labs: No results found for: HGBA1C, MPG No results found for: PROLACTIN No results found for: CHOL, TRIG, HDL, CHOLHDL, VLDL, LDLCALC   Current Medications: Current Outpatient Prescriptions  Medication Sig Dispense Refill  . acyclovir (ZOVIRAX) 200 MG capsule Take 200 mg by mouth 2 (two) times daily. Reported on 01/05/2016  3  . clonazePAM (KLONOPIN) 0.5 MG tablet Take 0.5 tablets (0.25 mg total)  by mouth 2 (two) times daily as needed. 60 tablet 2  . FLUoxetine (PROZAC) 20 MG capsule Take 1 capsule (20 mg total) by mouth daily. 30 capsule 2  . losartan (COZAAR) 25 MG tablet Take 1 tablet (25 mg total) by mouth daily. 30 tablet 6  . methylphenidate (CONCERTA) 36 MG PO CR tablet Take 1 tablet (36 mg total) by mouth daily. 30 tablet 0  . methylphenidate (CONCERTA) 36 MG PO CR tablet Take 1 tablet (36 mg total) by mouth daily. 30 tablet 0   No current facility-administered medications for this visit.     Neurologic: Headache: No Seizure: No Paresthesias:No  Musculoskeletal: Strength & Muscle Tone: within normal limits Gait & Station: normal Patient leans: N/A  Psychiatric Specialty Exam: Review of Systems  Constitutional: Positive for malaise/fatigue.  Psychiatric/Behavioral: Positive for depression. The patient is nervous/anxious.     Blood pressure 132/88, pulse 63, height 5\' 5"  (1.651 m), weight 143 lb 3.2 oz (65 kg).Body mass index is 23.83 kg/m.  General Appearance: Casual, Neat and Well Groomed  Eye Contact:  Fair  Speech:  Clear and Coherent  Volume:  Decreased  Mood: Good   Affect:Brighter   Thought Process:  Goal Directed  Orientation:  Full (Time, Place, and Person)  Thought Content:  Rumination  Suicidal Thoughts:  No  Homicidal Thoughts:  No  Memory:  Immediate;   Good Recent;   Good Remote;   Good  Judgement:  Good  Insight:  Fair  Psychomotor Activity:  Normal  Concentration:  Poor  Recall:  Good  Fund of Knowledge:Good  Language: Good  Akathisia:  No  Handed:  Right  AIMS (if indicated):    Assets:  Communication Skills Desire for Improvement Physical Health Resilience Social Support Vocational/Educational  ADL's:  Intact  Cognition: WNL  Sleep:  good    Treatment Plan Summary: Medication management   The patient will continueclonazepam 0.25 milligrams up to twice a day as needed. He will continue Range methylphenidate to Concerta 36  mg every morning for ADHD He will continue Prozac 20 mg daily and return to see me in 2 months   [name], MD 5/24/20181:25 PM

## 2017-01-17 NOTE — Telephone Encounter (Signed)
Received prior authorization request from office for Concerta. Called [phone] was told that Brand name is preferred and no authorization is required. They did see where a paid claim was received today from the pharmacy.

## 2017-01-18 NOTE — Telephone Encounter (Signed)
noted 

## 2017-02-01 ENCOUNTER — Telehealth (HOSPITAL_COMMUNITY): Payer: Self-pay | Admitting: *Deleted

## 2017-02-01 NOTE — Telephone Encounter (Signed)
phone call, regarding appointment with new provider. mailbox full.

## 2017-02-26 ENCOUNTER — Other Ambulatory Visit (HOSPITAL_COMMUNITY): Payer: Self-pay | Admitting: Psychiatry

## 2017-02-26 ENCOUNTER — Telehealth (HOSPITAL_COMMUNITY): Payer: Self-pay | Admitting: *Deleted

## 2017-02-26 MED ORDER — METHYLPHENIDATE HCL 20 MG PO TABS: 20.0000 mg | ORAL_TABLET | Freq: Two times a day (BID) | ORAL | 0 refills | Status: DC

## 2017-02-26 NOTE — Telephone Encounter (Signed)
He can go back to methylphenidate at a higher dose. I will leave for him to pick up

## 2017-02-26 NOTE — Telephone Encounter (Signed)
phone call from patient, said his Concerta not covered by his insurance.   They only cover the brand name and that will cost over $100.

## 2017-02-26 NOTE — Telephone Encounter (Signed)
Called patient & notified Rx available for pick-up

## 2017-03-07 DIAGNOSIS — E7800 Pure hypercholesterolemia, unspecified: Secondary | ICD-10-CM | POA: Diagnosis not present

## 2017-03-13 ENCOUNTER — Encounter (HOSPITAL_COMMUNITY): Payer: Self-pay | Admitting: Psychiatry

## 2017-03-13 ENCOUNTER — Ambulatory Visit (INDEPENDENT_AMBULATORY_CARE_PROVIDER_SITE_OTHER): Payer: 59 | Admitting: Psychiatry

## 2017-03-13 VITALS — BP 135/85 | HR 73 | Ht 65.0 in | Wt 146.4 lb

## 2017-03-13 DIAGNOSIS — Z87891 Personal history of nicotine dependence: Secondary | ICD-10-CM | POA: Diagnosis not present

## 2017-03-13 DIAGNOSIS — F321 Major depressive disorder, single episode, moderate: Secondary | ICD-10-CM | POA: Diagnosis not present

## 2017-03-13 MED ORDER — BUPROPION HCL 75 MG PO TABS: 75.0000 mg | ORAL_TABLET | ORAL | 2 refills | Status: DC

## 2017-03-13 MED ORDER — METHYLPHENIDATE HCL 20 MG PO TABS: 20.0000 mg | ORAL_TABLET | Freq: Two times a day (BID) | ORAL | 0 refills | Status: DC

## 2017-03-13 MED ORDER — FLUOXETINE HCL 20 MG PO CAPS: 20.0000 mg | ORAL_CAPSULE | Freq: Every day | ORAL | 2 refills | Status: DC

## 2017-03-13 MED ORDER — ALPRAZOLAM 0.5 MG PO TABS: 0.2500 mg | ORAL_TABLET | Freq: Two times a day (BID) | ORAL | 2 refills | Status: DC

## 2017-03-13 NOTE — Progress Notes (Signed)
Patient ID: Jason [name], male   DOB: 1981/03/03, 36 y.o.   MRN: [id number] Patient ID: Jason [name], male   DOB: 06/23/1981, 36 y.o.   MRN: [id number] Patient ID: Jason [name], male   DOB: 1981-07-17, 36 y.o.   MRN: [id number]  Psychiatric  Adult follow-up  Patient Identification: Deiondre Harrower MRN:  [id number] Date of Evaluation:  03/13/2017 Referral Source: Dr. Selinda Flavin Chief Complaint:   Chief Complaint    Depression; Anxiety; Follow-up     Visit Diagnosis:    ICD-10-CM   1. Moderate single current episode of major depressive disorder (HCC) F32.1     History of Present Illness:  This patient is a 36 year old married white male who lives with his wife in [place]. They have no children. He is a Tax adviser for [employer].  The patient was referred by his primary physician, Dr. Selinda Flavin for further assessment of depression and anxiety.  The patient states that he is always been a little bit on the depressive side and anxious person who is shy. However over the last 7 or 8 months he is become excessively anxious and worried. He wakes up with a feeling of dread and it doesn't go away. He's had little drive motivation or interest in doing anything that he usually enjoys. He's had several stressors. His wife has been very sick and has no energy and may have MS or another autoimmune disorder. Her doctors have not been able to figure out what is wrong. She does very little other than sleep all day.  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 He becomes anxious shy and panicky. His company was bought by a new firm and they are expecting a lot more out of him which is made him very anxious. He states that his eyes have difficulty with social anxiety. But lately he's had trouble even talking to his friends and just "can't come up with anything to say." He feels  very awkward and self-conscious much of the time.  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 He also enjoys painting. He has various projects that he just can't complete. His mind races and he can't decide what to do first. He often feels depressed and sad and feels like crying but can't. His energy is low. He denies suicidal ideation. He eats well and exercises 4-5 times a week. He doesn't use drugs and drinks very little. He sleeps very well but always feels tired. He has been tried on several antidepressants. Zoloft helped but made him very drowsy. Prozac and Celexa did not help and Wellbutrin made him feel wired. He's currently on a low dose of clonazepam but it makes him drowsy as well. He's never had past psychiatric treatment or counseling  The patient returns after 2 months. His life is been very stressful lately. His father-in-law got in a bad accident and was in ICU for 4 weeks and is now living with the patient and his wife. His work has been hot and stressful. He's feeling more depressed again and very tired all the time. I suggested we add a little bit of Wellbutrin to the Prozac to try to perk him up. He is also not sleeping well with the clonazepam and would like to try Xanax which I think is okay. His  focus is somewhat better with the methylphenidate. He feels very stuck in his life and would like to go back to college but doesn't see how he can spare the time or money. I suggested he get back into counseling with her new therapist and he agrees  Associated Signs/Symptoms: Depression Symptoms:  depressed mood, anhedonia, psychomotor retardation, fatigue, feelings of worthlessness/guilt, difficulty concentrating, anxiety, loss of energy/fatigue,  Anxiety Symptoms:  Excessive Worry, Panic Symptoms, Social Anxiety,   Past Psychiatric History: none  Previous Psychotropic Medications: Yes   Substance Abuse History in  the last 12 months:  No.  Consequences of Substance Abuse: NA  Past Medical History:  Past Medical History:  Diagnosis Date  . Anxiety disorder   . Elevated cholesterol   . Major depressive disorder    No past surgical history on file.  Family Psychiatric History: none  Family History:  Family History  Problem Relation Age of Onset  . Hypertension Mother   . Hyperlipidemia Mother   . Hypertension Father   . Heart attack Paternal Grandfather     Social History:   Social History   Social History  . Marital status: Married    Spouse name: N/A  . Number of children: N/A  . Years of education: N/A   Social History Main Topics  . Smoking status: Former Games developer  . Smokeless tobacco: Never Used     Comment: Quit 06-2008  . Alcohol use No     Comment: 12-07-15 per pt no  . Drug use: No     Comment: 12-07-2015 per pt no  . Sexual activity: Yes   Other Topics Concern  . None   Social History Narrative  . None    Additional Social History: Patient grew up with both parents and one older sister. He denies any history of trauma or abuse but states his parents are very poor and often had their lights cut off. He was often embarrassed to bring friends home. 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 He has been married for 7 years but has no children.  Allergies:  No Known Allergies  Metabolic Disorder Labs: No results found for: HGBA1C, MPG No results found for: PROLACTIN No results found for: CHOL, TRIG, HDL, CHOLHDL, VLDL, LDLCALC   Current Medications: Current Outpatient Prescriptions  Medication Sig Dispense Refill  . acyclovir (ZOVIRAX) 200 MG capsule Take 200 mg by mouth 2 (two) times daily. Reported on 01/05/2016  3  . FLUoxetine  (PROZAC) 20 MG capsule Take 1 capsule (20 mg total) by mouth daily. 30 capsule 2  . losartan (COZAAR) 25 MG tablet Take 1 tablet (25 mg total) by mouth daily. 30 tablet 6  . methylphenidate (RITALIN) 20 MG tablet Take 1 tablet (20 mg total) by mouth 2 (two) times daily with breakfast and lunch. 60 tablet 0  . ALPRAZolam (XANAX) 0.5 MG tablet Take 0.5 tablets (0.25 mg total) by mouth 2 (two) times daily. 30 tablet 2  . buPROPion (WELLBUTRIN) 75 MG tablet Take 1 tablet (75 mg total) by mouth every morning. 30 tablet 2   No current facility-administered medications for this visit.     Neurologic: Headache: No Seizure: No Paresthesias:No  Musculoskeletal: Strength & Muscle Tone: within normal  limits Gait & Station: normal Patient leans: N/A  Psychiatric Specialty Exam: Review of Systems  Constitutional: Positive for malaise/fatigue.  Psychiatric/Behavioral: Positive for depression. The patient is nervous/anxious.     Blood pressure 135/85, pulse 73, height 5\' 5"  (1.651 m), weight 146 lb 6.4 oz (66.4 kg).Body mass index is 24.36 kg/m.  General Appearance: Casual, Neat and Well Groomed  Eye Contact:  Fair  Speech:  Clear and Coherent  Volume:  Decreased  Mood: Depressed   Affect:Dysphoric and irritable   Thought Process:  Goal Directed  Orientation:  Full (Time, Place, and Person)  Thought Content:  Rumination  Suicidal Thoughts:  No  Homicidal Thoughts:  No  Memory:  Immediate;   Good Recent;   Good Remote;   Good  Judgement:  Good  Insight:  Fair  Psychomotor Activity:  Normal  Concentration:  Poor  Recall:  Good  Fund of Knowledge:Good  Language: Good  Akathisia:  No  Handed:  Right  AIMS (if indicated):    Assets:  Communication Skills Desire for Improvement Physical Health Resilience Social Support Vocational/Educational  ADL's:  Intact  Cognition: WNL  Sleep:  good    Treatment Plan Summary: Medication management   The patient will Into new Prozac 20 mg  daily but add Wellbutrin 75 mg daily with that. He will discontinue clonazepam and change to Xanax 0.25 mg twice a day as needed. He will continue methylphenidate 20 mg twice a day for ADD. He will be scheduled for therapy and return to see me in 4 weeks   Diannia RuderOSS, Vera Wishart, MD 7/18/20183:10 PM

## 2017-03-14 DIAGNOSIS — Z1389 Encounter for screening for other disorder: Secondary | ICD-10-CM | POA: Diagnosis not present

## 2017-03-14 DIAGNOSIS — E7800 Pure hypercholesterolemia, unspecified: Secondary | ICD-10-CM | POA: Diagnosis not present

## 2017-04-10 ENCOUNTER — Telehealth (HOSPITAL_COMMUNITY): Payer: Self-pay | Admitting: *Deleted

## 2017-04-10 ENCOUNTER — Ambulatory Visit (HOSPITAL_COMMUNITY): Payer: 59 | Admitting: Psychiatry

## 2017-04-10 NOTE — Telephone Encounter (Signed)
Called pt to resch appt for today due to provider out of office. Unable to reach pt or leave message due to pt voicemail box being full and staff can not leave message.

## 2017-04-11 ENCOUNTER — Ambulatory Visit (HOSPITAL_COMMUNITY): Payer: 59 | Admitting: Licensed Clinical Social Worker

## 2017-04-17 ENCOUNTER — Encounter (HOSPITAL_COMMUNITY): Payer: Self-pay | Admitting: Psychiatry

## 2017-04-17 ENCOUNTER — Ambulatory Visit (INDEPENDENT_AMBULATORY_CARE_PROVIDER_SITE_OTHER): Payer: 59 | Admitting: Psychiatry

## 2017-04-17 VITALS — BP 121/88 | HR 66 | Ht 65.0 in | Wt 149.2 lb

## 2017-04-17 DIAGNOSIS — R5383 Other fatigue: Secondary | ICD-10-CM

## 2017-04-17 DIAGNOSIS — R5381 Other malaise: Secondary | ICD-10-CM

## 2017-04-17 DIAGNOSIS — F321 Major depressive disorder, single episode, moderate: Secondary | ICD-10-CM

## 2017-04-17 DIAGNOSIS — F419 Anxiety disorder, unspecified: Secondary | ICD-10-CM

## 2017-04-17 DIAGNOSIS — Z87891 Personal history of nicotine dependence: Secondary | ICD-10-CM | POA: Diagnosis not present

## 2017-04-17 MED ORDER — ALPRAZOLAM 0.5 MG PO TABS
0.2500 mg | ORAL_TABLET | Freq: Two times a day (BID) | ORAL | 2 refills | Status: DC
Start: 2017-04-17 — End: 2017-07-24

## 2017-04-17 MED ORDER — LISDEXAMFETAMINE DIMESYLATE 40 MG PO CAPS: 40.0000 mg | ORAL_CAPSULE | ORAL | 0 refills | Status: DC

## 2017-04-17 NOTE — Progress Notes (Signed)
Patient ID: Jason [name], male   DOB: 29-Dec-1980, 36 y.o.   MRN: [id number] Patient ID: Jason [name], male   DOB: Feb 17, 1981, 36 y.o.   MRN: [id number] Patient ID: Jason [name], male   DOB: 02-28-1981, 36 y.o.   MRN: [id number]  Psychiatric  Adult follow-up  Patient Identification: Jason [name] MRN:  [id number] Date of Evaluation:  04/17/2017 Referral Source: Dr. Selinda [name] Chief Complaint:   Chief Complaint    Depression; Anxiety; ADD; Follow-up     Visit Diagnosis:    ICD-10-CM   1. Moderate single current episode of major depressive disorder (HCC) F32.1     History of Present Illness:  This patient is a 36 year old married white male who lives with his wife in [place]. They have no children. He is a Tax adviser for [employer].  The patient was referred by his primary physician, Dr. Selinda [name] for further assessment of depression and anxiety.  The patient states that he is always been a little bit on the depressive side and anxious person who is shy. However over the last 7 or 8 months he is become excessively anxious and worried. He wakes up with a feeling of dread and it doesn't go away. He's had little drive motivation or interest in doing anything that he usually enjoys. He's had several stressors. His wife has been very sick and has no energy and may have MS or another autoimmune disorder. Her doctors have not been able to figure out what is wrong. She does very little other than sleep all day.  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 He becomes anxious shy and panicky. His company was bought by a new firm and they are expecting a lot more out of him which is made him very anxious. He states that his eyes have difficulty with social anxiety. But lately he's had trouble even talking to his friends and just "can't come up with anything to say." He  feels very awkward and self-conscious much of the time.  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 He also enjoys painting. He has various projects that he just can't complete. His mind races and he can't decide what to do first. He often feels depressed and sad and feels like crying but can't. His energy is low. He denies suicidal ideation. He eats well and exercises 4-5 times a week. He doesn't use drugs and drinks very little. He sleeps very well but always feels tired. He has been tried on several antidepressants. Zoloft helped but made him very drowsy. Prozac and Celexa did not help and Wellbutrin made him feel wired. He's currently on a low dose of clonazepam but it makes him drowsy as well. He's never had past psychiatric treatment or counseling  The patient returns after 2 months. His life is been very stressful lately. His father-in-law got in a bad accident and was in ICU for 4 weeks and is now living with the patient and his wife. We tried adding Wellbutrin to his Prozac last time but he states most of the antidepressants make him feel bad. The Prozac made him very tired in the Wellbutrin made him jittery and anxious. He has stopped both of them. The low dose of Xanax has really helped his anxiety and sleep. The methylphenidate works but  he doesn't like having to take it twice a day and his insurance will pay for Concerta. I suggested we try Vyvanse and he is willing to give this a try. He is still scheduled to start counseling here  Associated Signs/Symptoms: Depression Symptoms:  depressed mood, anhedonia, psychomotor retardation, fatigue, feelings of worthlessness/guilt, difficulty concentrating, anxiety, loss of energy/fatigue,  Anxiety Symptoms:  Excessive Worry, Panic Symptoms, Social Anxiety,   Past Psychiatric History: none  Previous Psychotropic Medications: Yes   Substance Abuse History in the last 12  months:  No.  Consequences of Substance Abuse: NA  Past Medical History:  Past Medical History:  Diagnosis Date  . Anxiety disorder   . Elevated cholesterol   . Major depressive disorder    No past surgical history on file.  Family Psychiatric History: none  Family History:  Family History  Problem Relation Age of Onset  . Hypertension Mother   . Hyperlipidemia Mother   . Hypertension Father   . Heart attack Paternal Grandfather     Social History:   Social History   Social History  . Marital status: Married    Spouse name: N/A  . Number of children: N/A  . Years of education: N/A   Social History Main Topics  . Smoking status: Former Games developer  . Smokeless tobacco: Never Used     Comment: Quit 06-2008  . Alcohol use No     Comment: 12-07-15 per pt no  . Drug use: No     Comment: 12-07-2015 per pt no  . Sexual activity: Yes   Other Topics Concern  . None   Social History Narrative  . None    Additional Social History: Patient grew up with both parents and one older sister. He denies any history of trauma or abuse but states his parents are very poor and often had their lights cut off. He was often embarrassed to bring friends home. 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 He has been married for 7 years but has no children.  Allergies:  No Known Allergies  Metabolic Disorder Labs: No results found for: HGBA1C, MPG No results found for: PROLACTIN No results found for: CHOL, TRIG, HDL, CHOLHDL, VLDL, LDLCALC   Current Medications: Current Outpatient Prescriptions  Medication Sig Dispense Refill  . acyclovir (ZOVIRAX) 200 MG capsule Take 200 mg by mouth 2 (two) times daily. Reported on 01/05/2016  3  . ALPRAZolam (XANAX) 0.5 MG  tablet Take 0.5 tablets (0.25 mg total) by mouth 2 (two) times daily. 30 tablet 2  . lisdexamfetamine (VYVANSE) 40 MG capsule Take 1 capsule (40 mg total) by mouth every morning. 30 capsule 0  . losartan (COZAAR) 25 MG tablet Take 1 tablet (25 mg total) by mouth daily. 30 tablet 6   No current facility-administered medications for this visit.     Neurologic: Headache: No Seizure: No Paresthesias:No  Musculoskeletal: Strength & Muscle Tone: within normal limits Gait & Station: normal Patient leans: N/A  Psychiatric Specialty Exam: Review of Systems  Constitutional: Positive for malaise/fatigue.  Psychiatric/Behavioral: Positive for depression. The patient is nervous/anxious.     Blood pressure 121/88, pulse 66, height 5\' 5"  (1.651 m), weight 149 lb 3.2 oz (67.7 kg).Body mass index is  24.83 kg/m.  General Appearance: Casual, Neat and Well Groomed  Eye Contact:  Fair  Speech:  Clear and Coherent  Volume:  Decreased  Mood: Fairly good   Affect:Brighter   Thought Process:  Goal Directed  Orientation:  Full (Time, Place, and Person)  Thought Content:  Rumination  Suicidal Thoughts:  No  Homicidal Thoughts:  No  Memory:  Immediate;   Good Recent;   Good Remote;   Good  Judgement:  Good  Insight:  Fair  Psychomotor Activity:  Normal  Concentration:  Poor  Recall:  Good  Fund of Knowledge:Good  Language: Good  Akathisia:  No  Handed:  Right  AIMS (if indicated):    Assets:  Communication Skills Desire for Improvement Physical Health Resilience Social Support Vocational/Educational  ADL's:  Intact  Cognition: WNL  Sleep:  good    Treatment Plan Summary: Medication management   The patient will Continue Xanax 0.25 mg twice a day as needed. He will discontinue methylphenidate and start Vyvanse 40 mg every morning for ADD He will be scheduled for therapy and return to see me in 4 weeks   [name], MD 8/22/20188:57 AM

## 2017-04-25 ENCOUNTER — Ambulatory Visit (INDEPENDENT_AMBULATORY_CARE_PROVIDER_SITE_OTHER): Payer: 59 | Admitting: Licensed Clinical Social Worker

## 2017-04-25 DIAGNOSIS — F321 Major depressive disorder, single episode, moderate: Secondary | ICD-10-CM

## 2017-04-25 NOTE — Progress Notes (Signed)
Comprehensive Clinical Assessment (CCA) Note  04/25/2017 Jason [name] [id number]  Visit Diagnosis:      ICD-10-CM   1. Major depressive disorder, single episode, moderate with anxious distress (HCC) F32.1       CCA Part One  Part One has been completed on paper by the patient.  (See scanned document in Chart Review)  CCA Part Two A  Intake/Chief Complaint:  CCA Intake With Chief Complaint CCA Part Two Date: 04/25/17 CCA Part Two Time: 1005 Chief Complaint/Presenting Problem: Depression (Patient is a 36 year old Caucasian male that presents oriented x5 (person, place, situation, time and object), alert, anxious, average height, average weight, casually dressed, and cooperative) Patients Currently Reported Symptoms/Problems: Mood: always tired, feels like he is wasting his life because he can't accomplish what he wants, difficulty staying asleep, lack of motivation, down and depressed, increase in irritability, feels like he is on the verge of crying at times, mild concentration issues, sometimes feels insecure/not good enough,  some back aches, Anxiety: worry,, social anxiety (doesn't feel confident in talking to people, looking at him, judging him ), gets anxious around crowds, racing thoughts, past thoughts/experiences pop up from time to time  Collateral Involvement: None Individual's Strengths: Art, musician, painting, remodeling, likes to learn, can work on cars, [employer], honest,  Individual's Preferences: Prefers geniune people, prefers Producer, television/film/videolearning, Doesn't prefer arrogance/cockiness Individual's Abilities: Art, musician, painting, remodeling, likes to learn, can work on cars, geniune, honest Type of Services Patient Feels Are Needed: Medication management, and trying outpatient therapy  Initial Clinical Notes/Concerns: Symptoms started around age 36 or [age], he felt somewhat ashamed of being raised in a poor family, symptoms occur daily, symptoms mild to moderate   Mental  Health Symptoms Depression:  Depression: Change in energy/activity, Difficulty Concentrating, Fatigue, Irritability  Mania:  Mania: N/A  Anxiety:   Anxiety: Worrying, Tension, Irritability, Difficulty concentrating  Psychosis:  Psychosis: N/A  Trauma:  Trauma: N/A  Obsessions:  Obsessions: N/A  Compulsions:  Compulsions: N/A  Inattention:  Inattention: N/A  Hyperactivity/Impulsivity:  Hyperactivity/Impulsivity: N/A  Oppositional/Defiant Behaviors:  Oppositional/Defiant Behaviors: N/A  Borderline Personality:  Emotional Irregularity: N/A  Other Mood/Personality Symptoms:  Other Mood/Personality Symtpoms: None    Mental Status Exam Appearance and self-care  Stature:  Stature: Average  Weight:  Weight: Thin  Clothing:  Clothing: Casual  Grooming:  Grooming: Normal  Cosmetic use:  Cosmetic Use: None  Posture/gait:  Posture/Gait: Normal  Motor activity:  Motor Activity: Not Remarkable  Sensorium  Attention:  Attention: Normal  Concentration:  Concentration: Normal  Orientation:  Orientation: X5  Recall/memory:  Recall/Memory: Normal  Affect and Mood  Affect:  Affect: Anxious  Mood:  Mood: Anxious  Relating  Eye contact:  Eye Contact: Normal  Facial expression:  Facial Expression: Responsive  Attitude toward examiner:  Attitude Toward Examiner: Cooperative  Thought and Language  Speech flow: Speech Flow: Normal  Thought content:  Thought Content: Appropriate to mood and circumstances  Preoccupation:  Preoccupations:  (None)  Hallucinations:  Hallucinations:  (None)  Organization:   Logical   Company secretaryxecutive Functions  Fund of Knowledge:  Fund of Knowledge: Average  Intelligence:  Intelligence: Average  Abstraction:  Abstraction: Normal  Judgement:  Judgement: Normal  Reality Testing:  Reality Testing: Adequate  Insight:  Insight: Good  Decision Making:  Decision Making: Normal  Social Functioning  Social Maturity:  Social Maturity: Isolates  Social Judgement:  Social Judgement:  Normal  Stress  Stressors:  Stressors: Illness, Work  Coping Ability:  Coping Ability: Overwhelmed  Skill Deficits:   Interacting with others, future plans  Supports:   Family    Family and Psychosocial History: Family history Marital status: Married Number of Years Married: 9 What types of issues is patient dealing with in the relationship?: None Additional relationship information: None  Are you sexually active?: Yes What is your sexual orientation?: Heterosexual Has your sexual activity been affected by drugs, alcohol, medication, or emotional stress?: Emotional stress  Does patient have children?: No  Childhood History:  Childhood History By whom was/is the patient raised?: Both parents Additional childhood history information: Grew up in lower income home, parents divorced when patient was 5  Description of patient's relationship with caregiver when they were a child: Good relationship with parents Patient's description of current relationship with people who raised him/her: Good relationship with parents  How were you disciplined when you got in trouble as a child/adolescent?: Occasional spankings, grounding  Does patient have siblings?: Yes Number of Siblings: 1 Description of patient's current relationship with siblings: Good relationship with sister  Did patient suffer any verbal/emotional/physical/sexual abuse as a child?: No Did patient suffer from severe childhood neglect?: No Has patient ever been sexually abused/assaulted/raped as an adolescent or adult?: No Was the patient ever a victim of a crime or a disaster?: No Witnessed domestic violence?: No Has patient been effected by domestic violence as an adult?: No  CCA Part Two B  Employment/Work Situation: Employment / Work Psychologist, occupational Employment situation: Employed Where is patient currently employed?: [employer] How long has patient been employed?: 8 months Patient's job has been impacted by current illness:  Yes Describe how patient's job has been impacted: The job is high pace and stressful What is the longest time patient has a held a job?: 4 years Where was the patient employed at that time?: [employer]  Has patient ever been in the [employer]?: No Has patient ever served in combat?: No Did You Receive Any Psychiatric Treatment/Services While in Equities trader?: No Are There Guns or Other Weapons in Your Home?: Yes Types of Guns/Weapons: Biochemist, clinical  Are These Comptroller?: Yes  Education: Education School Currently Attending: N/A: Adult Last Grade Completed: 12 Name of High School: [employer]  Did Garment/textile technologist From [employer]?: Yes Did Theme park manager?:  (Attended a year of college) Did You Attend Graduate School?: No Did You Have Any Special Interests In School?: Art Did You Have An Individualized Education Program (IIEP): No Did You Have Any Difficulty At School?: Yes Were Any Medications Ever Prescribed For These Difficulties?: No  Religion: Religion/Spirituality Are You A Religious Person?: Yes What is Your Religious Affiliation?: Christian How Might This Affect Treatment?: No impact   Leisure/Recreation: Leisure / Recreation Leisure and Hobbies: Music, art/painting, learning, watching movies, fish occasionally  Exercise/Diet: Exercise/Diet Do You Exercise?: Yes What Type of Exercise Do You Do?: Run/Walk How Many Times a Week Do You Exercise?: 4-5 times a week Have You Gained or Lost A Significant Amount of Weight in the Past Six Months?: No Do You Follow a Special Diet?: No Do You Have Any Trouble Sleeping?: Yes Explanation of Sleeping Difficulties: Difficulty staying asleep  CCA Part Two C  Alcohol/Drug Use: Alcohol / Drug Use Pain Medications: See patient record Prescriptions: See patient record Over the Counter: See patient record History of alcohol / drug use?: No history of alcohol / drug abuse (Drinks on occasion)  CCA Part Three  ASAM's:  Six Dimensions of Multidimensional Assessment  Dimension 1:  Acute Intoxication and/or Withdrawal Potential:  Dimension 1:  Comments: None  Dimension 2:  Biomedical Conditions and Complications:  Dimension 2:  Comments: None  Dimension 3:  Emotional, Behavioral, or Cognitive Conditions and Complications:  Dimension 3:  Comments: None  Dimension 4:  Readiness to Change:  Dimension 4:  Comments: None  Dimension 5:  Relapse, Continued use, or Continued Problem Potential:  Dimension 5:  Comments: None  Dimension 6:  Recovery/Living Environment:  Dimension 6:  Recovery/Living Environment Comments: None   Substance use Disorder (SUD)    Social Function:  Social Functioning Social Maturity: Isolates Social Judgement: Normal  Stress:  Stress Stressors: Illness, Work Coping Ability: Overwhelmed Patient Takes Medications The Way The Doctor Instructed?: Yes Priority Risk: Low Acuity  Risk Assessment- Self-Harm Potential: Risk Assessment For Self-Harm Potential Thoughts of Self-Harm: No current thoughts Method: No plan Availability of Means: No access/NA  Risk Assessment -Dangerous to Others Potential: Risk Assessment For Dangerous to Others Potential Method: No Plan Availability of Means: No access or NA Intent: Vague intent or NA Notification Required: No need or identified person  DSM5 Diagnoses: Patient Active Problem List   Diagnosis Date Noted  . Generalized anxiety disorder 12/07/2015    Patient Centered Plan: Patient is on the following Treatment Plan(s):  Anxiety and Depression  Recommendations for Services/Supports/Treatments: Recommendations for Services/Supports/Treatments Recommendations For Services/Supports/Treatments: Individual Therapy, Medication Management  Treatment Plan Summary:   Patient is a 36 year old Caucasian male that presents oriented x5 (person, place, situation, time and object), alert, anxious, average  height, average weight, casually dressed, and cooperative for an assessment on a referral from Dr. Tenny Craw to address mood and anxiety. Patient has minimal medical treatment history. Patient has a history of mental health treatment including outpatient therapy and medication management. Patient denies symptoms of mania. He denies suicidal and homicidal ideations. Patient denies psychosis including auditory and visual hallucinations. Patient denies substance use. He is at low risk for lethality at this time. Patient would benefit from outpatient therapy with a CBT approach 1-4 times a month to address mood and anxiety. Patient would also benefit from continued medication management to address mood and anxiety.   Referrals to Alternative Service(s): Referred to Alternative Service(s):   Place:   Date:   Time:    Referred to Alternative Service(s):   Place:   Date:   Time:    Referred to Alternative Service(s):   Place:   Date:   Time:    Referred to Alternative Service(s):   Place:   Date:   Time:     Jason Bellows, LCSW

## 2017-05-15 ENCOUNTER — Ambulatory Visit (INDEPENDENT_AMBULATORY_CARE_PROVIDER_SITE_OTHER): Payer: 59 | Admitting: Psychiatry

## 2017-05-15 ENCOUNTER — Encounter (HOSPITAL_COMMUNITY): Payer: Self-pay | Admitting: Psychiatry

## 2017-05-15 VITALS — BP 128/80 | HR 74 | Ht 65.0 in | Wt 145.0 lb

## 2017-05-15 DIAGNOSIS — F900 Attention-deficit hyperactivity disorder, predominantly inattentive type: Secondary | ICD-10-CM

## 2017-05-15 DIAGNOSIS — F321 Major depressive disorder, single episode, moderate: Secondary | ICD-10-CM

## 2017-05-15 DIAGNOSIS — Z87891 Personal history of nicotine dependence: Secondary | ICD-10-CM

## 2017-05-15 DIAGNOSIS — F419 Anxiety disorder, unspecified: Secondary | ICD-10-CM | POA: Diagnosis not present

## 2017-05-15 DIAGNOSIS — Z79899 Other long term (current) drug therapy: Secondary | ICD-10-CM | POA: Diagnosis not present

## 2017-05-15 MED ORDER — LISDEXAMFETAMINE DIMESYLATE 60 MG PO CAPS: 60.0000 mg | ORAL_CAPSULE | ORAL | 0 refills | Status: DC
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
## 2017-05-16 ENCOUNTER — Ambulatory Visit (HOSPITAL_COMMUNITY): Payer: 59 | Admitting: Licensed Clinical Social Worker

## 2017-05-30 ENCOUNTER — Ambulatory Visit (INDEPENDENT_AMBULATORY_CARE_PROVIDER_SITE_OTHER): Payer: 59 | Admitting: Licensed Clinical Social Worker

## 2017-05-30 DIAGNOSIS — F321 Major depressive disorder, single episode, moderate: Secondary | ICD-10-CM | POA: Diagnosis not present

## 2017-05-30 NOTE — Progress Notes (Signed)
   THERAPIST PROGRESS NOTE  Session Time: 9:00 am-9:50  Participation Level: Active  Behavioral Response: CasualAlertEuthymic  Type of Therapy: Individual Therapy  Treatment Goals addressed: Coping  Interventions: CBT and Solution Focused  Summary: Jason [name] is a 36 y.o. male who presents oriented x5 (person, place, situation, time and object), alert, anxious, average height, average weight, casually dressed, and cooperative to address mood and anxiety. Patient has minimal medical treatment history. Patient has a history of mental health treatment including outpatient therapy and medication management. Patient denies symptoms of mania. He denies suicidal and homicidal ideations. Patient denies psychosis including auditory and visual hallucinations. Patient denies substance use. He is at low risk for lethality at this time.  Patient had an average score of 5.25 out of 10 on the Outcome Rating Scale. Patient reported that he was feeling frustrated and down. Patient noted that he feels like all he does is work and then work around [employer]. Patient has regrets about not pursuing his artistic talents more (music, art) and he is now stuck in a job working 10 hour days with no time for himself or creativity. Patient noted that his father in law in staying in the home with them now which adds to the feeling that he gets no time for himself. Patient feels guilty if he is not staying busy or if he were to ask for time for himself at home. After discussion, patient understood that he needs to take time for himself which will help with his mood. Patient committed to take time for himself to pursue his creative outlets. Patient rated the session 9.5 out of 10 on the Session Rating Scale.  Patient engaged in session He responded well to interventions. Patient continues to meet criteria for Major depressive disorder, single episode, moderate with anxious distress. Patient will continue in  outpatient therapy due to being the least restrictive service to meet his needs at this time. Patient made no progress on his goal.   Suicidal/Homicidal: Negativewithout intent/plan  Therapist Response: Therapist reviewed patient's recent thoughts and behaviors. Therapist utilized CBT to address mood. Therapist processed patient's feelings to identify triggers. Therapist assisted patient in identifying ways to take time for himself. Therapist committed patient to take time for himself. Therapist administered the Outcome Rating Scale and the Session Rating Scale.  Plan: Return again in 3 weeks.  Diagnosis: Axis I: Major depressive disorder, single episode, moderate with anxious distress    Axis [name]: No diagnosis    Bynum Bellows, LCSW 05/30/2017

## 2017-06-11 DIAGNOSIS — I10 Essential (primary) hypertension: Secondary | ICD-10-CM | POA: Insufficient documentation

## 2017-06-11 HISTORY — DX: Essential (primary) hypertension: I10

## 2017-06-27 ENCOUNTER — Ambulatory Visit (HOSPITAL_COMMUNITY): Payer: Self-pay | Admitting: Licensed Clinical Social Worker

## 2017-07-01 ENCOUNTER — Telehealth (HOSPITAL_COMMUNITY): Payer: Self-pay | Admitting: *Deleted

## 2017-07-01 NOTE — Telephone Encounter (Signed)
Pt called stating he would like to know if provider could put him back on the Prozac. Per pt he's been super depressed and thought he would have been better without it. Per pt he don't think he should be off of this med right now and will discuss more details when he comes in to see provider. Pt number is [phone].

## 2017-07-02 ENCOUNTER — Other Ambulatory Visit (HOSPITAL_COMMUNITY): Payer: Self-pay | Admitting: Psychiatry

## 2017-07-02 MED ORDER — FLUOXETINE HCL 20 MG PO CAPS: 20.0000 mg | ORAL_CAPSULE | Freq: Every day | ORAL | 2 refills | Status: DC

## 2017-07-02 NOTE — Telephone Encounter (Signed)
[facility]

## 2017-07-03 NOTE — Telephone Encounter (Signed)
Called pt to verify if he received his medications. lmtcb and office number provided on pt voicemail.

## 2017-07-16 ENCOUNTER — Telehealth (HOSPITAL_COMMUNITY): Payer: Self-pay | Admitting: *Deleted

## 2017-07-16 NOTE — Telephone Encounter (Signed)
returned phone call, left voice message.  appointment for 07/17/17 cancelled as requested.

## 2017-07-17 ENCOUNTER — Ambulatory Visit (HOSPITAL_COMMUNITY): Payer: 59 | Admitting: Psychiatry

## 2017-07-24 ENCOUNTER — Ambulatory Visit (HOSPITAL_COMMUNITY): Payer: 59 | Admitting: Psychiatry

## 2017-07-24 ENCOUNTER — Encounter (HOSPITAL_COMMUNITY): Payer: Self-pay | Admitting: Psychiatry

## 2017-07-24 VITALS — BP 130/84 | HR 108 | Ht 65.0 in | Wt 147.0 lb

## 2017-07-24 DIAGNOSIS — F900 Attention-deficit hyperactivity disorder, predominantly inattentive type: Secondary | ICD-10-CM | POA: Diagnosis not present

## 2017-07-24 DIAGNOSIS — F321 Major depressive disorder, single episode, moderate: Secondary | ICD-10-CM

## 2017-07-24 DIAGNOSIS — Z79899 Other long term (current) drug therapy: Secondary | ICD-10-CM

## 2017-07-24 DIAGNOSIS — F419 Anxiety disorder, unspecified: Secondary | ICD-10-CM

## 2017-07-24 DIAGNOSIS — Z87891 Personal history of nicotine dependence: Secondary | ICD-10-CM | POA: Diagnosis not present

## 2017-07-24 MED ORDER — FLUOXETINE HCL 20 MG PO CAPS: 20.0000 mg | ORAL_CAPSULE | Freq: Every day | ORAL | 2 refills | Status: DC

## 2017-07-24 MED ORDER — LISDEXAMFETAMINE DIMESYLATE 60 MG PO CAPS: 60.0000 mg | ORAL_CAPSULE | ORAL | 0 refills | Status: DC

## 2017-07-24 MED ORDER — ALPRAZOLAM 0.5 MG PO TABS
0.2500 mg | ORAL_TABLET | Freq: Two times a day (BID) | ORAL | 2 refills | Status: DC
Start: 2017-07-24 — End: 2017-10-24

## 2017-07-24 MED ORDER — AMPHETAMINE-DEXTROAMPHETAMINE 30 MG PO TABS: 30.0000 mg | ORAL_TABLET | Freq: Two times a day (BID) | ORAL | 0 refills | Status: DC

## 2017-07-24 NOTE — Progress Notes (Signed)
BH MD/PA/NP OP Progress Note  07/24/2017 10:12 AM Jason [name]  MRN:  [id number]  Chief Complaint:  Chief Complaint    Depression; ADHD; Follow-up     HPI: This patient is a 36 year old married white male who lives [place]. They have no children. He is currently working in a warehouse  The patient was referred by his primary physician, Dr. Selinda FlavinKevin Howard for further assessment of depression and anxiety.  The patient states that he is always been a little bit on the depressive side and anxious person who is shy. However over the last 7 or 8 months he is become excessively anxious and worried. He wakes up with a feeling of dread and it doesn't go away. He's had little drive motivation or interest in doing anything that he usually enjoys. He's had several stressors. His wife has been very sick and has no energy and may have MS or another autoimmune disorder. Her doctors have not been able to figure out what is wrong. She does very little other than sleep all day.  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 He becomes anxious shy and panicky. His company was bought by a new firm and they are expecting a lot more out of him which is made him very anxious. He states that his eyes have difficulty with social anxiety. But lately he's had trouble even talking to his friends and just "can't come up with anything to say." He feels very awkward and self-conscious much of the time.  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 He also enjoys painting. He has various projects that he just can't complete. His mind races and he can't decide what to do first. He often feels depressed and sad and feels like crying but can't. His energy is low. He denies suicidal ideation. He eats well and exercises 4-5 times a week. He doesn't use  drugs and drinks very little. He sleeps very well but always feels tired. He has been tried on several antidepressants. Zoloft helped but made him very drowsy. Prozac and Celexa did not help and Wellbutrin made him feel wired. He's currently on a low dose of clonazepam but it makes him drowsy as well. He's never had past psychiatric treatment or counseling  Patient returns after 2 months.  He called a couple of weeks ago stating he had become depressed again and want to go back on Prozac.  His energy has been very low as well.  He states that he is chronically taken acyclovir for cold sores and he thinks this may be contributing to low energy.  He stopped it and feels a little bit better.  His depression is starting to lift with the Prozac.  He is on Vyvanse 60 mg which works fairly well but sometimes he does not need medication until later in the day.  He would like to try something short acting.  I told him we could try Adderall 30 mg twice a day but I will still given the Vyvanse in case this does not work out. Visit Diagnosis:    ICD-10-CM   1. Major depressive disorder, single episode, moderate with anxious distress (HCC) F32.1   2. Attention deficit hyperactivity disorder (ADHD), predominantly inattentive type F90.0     Past Psychiatric History: none  Past Medical History:  Past Medical History:  Diagnosis Date  . Anxiety disorder   . Elevated cholesterol   . Major depressive disorder    History reviewed. No pertinent surgical history.  Family Psychiatric History: None  Family History:  Family History  Problem Relation Age of Onset  . Hypertension Mother   . Hyperlipidemia Mother   . Hypertension Father   . Heart attack Paternal Grandfather     Social History:  Social History   Socioeconomic History  . Marital status: Married    Spouse name: None  . Number of children: None  . Years of education: None  . Highest education level: None  Social Needs  . Financial resource  strain: None  . Food insecurity - worry: None  . Food insecurity - inability: None  . Transportation needs - medical: None  . Transportation needs - non-medical: None  Occupational History  . None  Tobacco Use  . Smoking status: Former Games developer  . Smokeless tobacco: Never Used  . Tobacco comment: Quit 06-2008  Substance and Sexual Activity  . Alcohol use: No    Alcohol/week: 0.0 oz    Comment: 12-07-15 per pt no  . Drug use: No    Comment: 12-07-2015 per pt no  . Sexual activity: Yes  Other Topics Concern  . None  Social History Narrative  . None    Allergies: No Known Allergies  Metabolic Disorder Labs: No results found for: HGBA1C, MPG No results found for: PROLACTIN No results found for: CHOL, TRIG, HDL, CHOLHDL, VLDL, LDLCALC No results found for: TSH  Therapeutic Level Labs: No results found for: LITHIUM No results found for: VALPROATE No components found for:  CBMZ  Current Medications: Current Outpatient Medications  Medication Sig Dispense Refill  . acyclovir (ZOVIRAX) 200 MG capsule Take 200 mg by mouth 2 (two) times daily. Reported on 01/05/2016  3  . ALPRAZolam (XANAX) 0.5 MG tablet Take 0.5 tablets (0.25 mg total) by mouth 2 (two) times daily. 30 tablet 2  . FLUoxetine (PROZAC) 20 MG capsule Take 1 capsule (20 mg total) by mouth daily. 30 capsule 2  . lisdexamfetamine (VYVANSE) 60 MG capsule Take 1 capsule (60 mg total) by mouth every morning. 30 capsule 0  . lisdexamfetamine (VYVANSE) 60 MG capsule Take 1 capsule (60 mg total) by mouth every morning. 30 capsule 0  . amphetamine-dextroamphetamine (ADDERALL) 30 MG tablet Take 1 tablet by mouth 2 (two) times daily. 60 tablet 0  . lisdexamfetamine (VYVANSE) 60 MG capsule Take 1 capsule (60 mg total) by mouth every morning. 30 capsule 0  . losartan (COZAAR) 25 MG tablet Take 1 tablet (25 mg total) by mouth daily. 30 tablet 6   No current facility-administered medications for this visit.       Musculoskeletal: Strength & Muscle Tone: within normal limits Gait & Station: normal Patient leans: N/A  Psychiatric Specialty Exam: Review of Systems  All other systems reviewed and are negative.   Blood pressure 130/84, pulse (!) 108, height 5\' 5"  (1.651 m), weight 147 lb (66.7 kg), SpO2 96 %.Body mass index is 24.46 kg/m.  General Appearance: Casual and Fairly Groomed  Eye Contact:  Good  Speech:  Clear and Coherent  Volume:  Normal  Mood:  Euthymic  Affect:  Congruent  Thought Process:  Goal Directed  Orientation:  Full (Time, Place, and Person)  Thought Content: Logical   Suicidal Thoughts:  No  Homicidal Thoughts:  No  Memory:  Immediate;   Good Recent;   Good  Remote;   Good  Judgement:  Fair  Insight:  Fair  Psychomotor Activity:  Normal  Concentration:  Concentration: Good and Attention Span: Good with medication  Recall:  Good  Fund of Knowledge: Good  Language: Good  Akathisia:  No  Handed:  Right  AIMS (if indicated): not done  Assets:  Communication Skills Desire for Improvement Physical Health Resilience Social Support Talents/Skills  ADL's:  Intact  Cognition: WNL  Sleep:  Good   Screenings:   Assessment and Plan: This patient is a 36 year old male with a history of ADHD and depression.  He is focusing well on Vyvanse 60 mg daily.  However it wears off early and sometimes he would rather take something that is shorter acting so we will try switch to Adderall 30 mg twice daily.  He will continue Prozac 20 mg daily for depression and Xanax 0.25-0.5 mg at bedtime for sleep and anxiety.  She will return to see me in 3 months   Diannia Rudereborah Yacob Wilkerson, MD 07/24/2017, 10:12 AM

## 2017-08-26 ENCOUNTER — Telehealth (HOSPITAL_COMMUNITY): Payer: Self-pay

## 2017-08-26 NOTE — Telephone Encounter (Signed)
Medication management - Telephone call with pt. after he left a message he would like a new prescription for Adderall 30 mg BID as states this works better for him but only had the one prescription from his last visit.  Pt. does not return until 10/24/17.  Patient reported he has about 4-5 days left of the Adderall but will need a new prescription this week.  Informed patient Dr. Tenny Crawoss is out of the office until 08/28/17 but would send the request to her and asked patient to call back 08/28/17 to make sure the order was being filled.  Patient agreed with plan as states Adderall works better than once a day Vyvanse.

## 2017-08-28 ENCOUNTER — Other Ambulatory Visit (HOSPITAL_COMMUNITY): Payer: Self-pay | Admitting: Psychiatry

## 2017-08-28 MED ORDER — AMPHETAMINE-DEXTROAMPHETAMINE 30 MG PO TABS: 30.0000 mg | ORAL_TABLET | Freq: Two times a day (BID) | ORAL | 0 refills | Status: DC

## 2017-08-28 NOTE — Telephone Encounter (Signed)
Medication management - Telephone message left for patient that Dr. Tenny Crawoss had sent in 2 new [facility] orders [facility] [place] and requested patient call back if any questions or problems filling prescriptions.

## 2017-08-28 NOTE — Telephone Encounter (Signed)
2 prescriptions for Adderall sent in, please let him know

## 2017-10-03 ENCOUNTER — Telehealth (HOSPITAL_COMMUNITY): Payer: Self-pay | Admitting: *Deleted

## 2017-10-03 ENCOUNTER — Other Ambulatory Visit (HOSPITAL_COMMUNITY): Payer: Self-pay | Admitting: Psychiatry

## 2017-10-03 MED ORDER — AMPHETAMINE-DEXTROAMPHETAMINE 30 MG PO TABS: 30.0000 mg | ORAL_TABLET | Freq: Two times a day (BID) | ORAL | 0 refills | Status: DC

## 2017-10-03 NOTE — Telephone Encounter (Signed)
Dr Tenny Crawoss Patient wife called stating that [facility] has Adderall on back order & isn't sure  When they may get more. Patient is requesting hand written script to take to another pharmacy.  [facility] informed that since it was already e-scribe to them he would need to request a hand written

## 2017-10-03 NOTE — Telephone Encounter (Signed)
printed

## 2017-10-24 ENCOUNTER — Ambulatory Visit (HOSPITAL_COMMUNITY): Payer: 59 | Admitting: Psychiatry

## 2017-10-24 ENCOUNTER — Encounter (HOSPITAL_COMMUNITY): Payer: Self-pay | Admitting: Psychiatry

## 2017-10-24 VITALS — BP 133/82 | HR 84 | Ht 65.0 in | Wt 144.0 lb

## 2017-10-24 DIAGNOSIS — F900 Attention-deficit hyperactivity disorder, predominantly inattentive type: Secondary | ICD-10-CM | POA: Diagnosis not present

## 2017-10-24 DIAGNOSIS — F321 Major depressive disorder, single episode, moderate: Secondary | ICD-10-CM

## 2017-10-24 DIAGNOSIS — Z79899 Other long term (current) drug therapy: Secondary | ICD-10-CM

## 2017-10-24 DIAGNOSIS — Z87891 Personal history of nicotine dependence: Secondary | ICD-10-CM | POA: Diagnosis not present

## 2017-10-24 DIAGNOSIS — F419 Anxiety disorder, unspecified: Secondary | ICD-10-CM | POA: Diagnosis not present

## 2017-10-24 MED ORDER — AMPHETAMINE-DEXTROAMPHETAMINE 30 MG PO TABS: 30.0000 mg | ORAL_TABLET | Freq: Two times a day (BID) | ORAL | 0 refills | Status: DC

## 2017-10-24 MED ORDER — LAMOTRIGINE 25 MG PO TABS: ORAL_TABLET | ORAL | 2 refills | Status: DC

## 2017-10-24 MED ORDER — ALPRAZOLAM 0.5 MG PO TABS: 0.2500 mg | ORAL_TABLET | Freq: Two times a day (BID) | ORAL | 2 refills | Status: DC

## 2017-10-24 MED ORDER — FLUOXETINE HCL 20 MG PO CAPS: 20.0000 mg | ORAL_CAPSULE | Freq: Every day | ORAL | 2 refills | Status: DC

## 2017-10-24 NOTE — Progress Notes (Signed)
BH MD/PA/NP OP Progress Note  10/24/2017 10:00 AM Jason [name]  MRN:  [id number]  Chief Complaint:  Chief Complaint    Depression; Anxiety; Follow-up     HPI: This patient is a 37 year old married white male who lives [place] in [place]. They have no children. He is currently working in a warehouse  The patient was referred by his primary physician, Dr. Selinda FlavinKevin Howard for further assessment of depression and anxiety.  The patient states that he is always been a little bit on the depressive side and anxious person who is shy. However over the last 7 or 8 months he is become excessively anxious and worried. He wakes up with a feeling of dread and it doesn't go away. He's had little drive motivation or interest in doing anything that he usually enjoys. He's had several stressors. His wife has been very sick and has no energy and may have MS or another autoimmune disorder. Her doctors have not been able to figure out what is wrong. She does very little other than sleep all day.  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 He becomes anxious shy and panicky. His company was bought by a new firm and they are expecting a lot more out of him which is made him very anxious. He states that his eyes have difficulty with social anxiety. But lately he's had trouble even talking to his friends and just "can't come up with anything to say." He feels very awkward and self-conscious much of the time.  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 He also enjoys painting. He has various projects that he just can't complete. His mind races and he can't decide what to do first. He often feels depressed and sad and feels like crying but can't. His energy is low. He denies suicidal ideation. He eats well and exercises 4-5 times a week. He doesn't use  drugs and drinks very little. He sleeps very well but always feels tired. He has been tried on several antidepressants. Zoloft helped but made him very drowsy. Prozac and Celexa did not help and Wellbutrin made him feel wired. He's currently on a low dose of clonazepam but it makes him drowsy as well. He's never had past psychiatric treatment or counseling  Patient returns after 2 months.  He states he is tired from his job which involves heavy physical labor.  The Prozac has helped his mood to some degree but he still feels like he has rapid shifts in his mood at times without provocation.  The Adderall is doing very well and helping his ADHD symptoms.  He denies being suicidal.  He is spending a lot of time on his days off playing music which makes him happy.  He is sleeping well and eating fairly well.  He is no longer having any hair loss.  Since he is having the mood shifts I suggested we try Lamictal and he is agreeable Visit Diagnosis:    ICD-10-CM   1. Major depressive disorder, single episode, moderate with anxious distress (HCC) F32.1   2. Attention deficit hyperactivity disorder (ADHD), predominantly inattentive type F90.0     Past Psychiatric History: none  Past Medical History:  Past Medical History:  Diagnosis Date  . Anxiety disorder   . Elevated cholesterol   . Major depressive disorder  History reviewed. No pertinent surgical history.  Family Psychiatric History: none  Family History:  Family History  Problem Relation Age of Onset  . Hypertension Mother   . Hyperlipidemia Mother   . Hypertension Father   . Heart attack Paternal Grandfather     Social History:  Social History   Socioeconomic History  . Marital status: Married    Spouse name: None  . Number of children: None  . Years of education: None  . Highest education level: None  Social Needs  . Financial resource strain: None  . Food insecurity - worry: None  . Food insecurity - inability: None  .  Transportation needs - medical: None  . Transportation needs - non-medical: None  Occupational History  . None  Tobacco Use  . Smoking status: Former Games developer  . Smokeless tobacco: Never Used  . Tobacco comment: Quit 06-2008  Substance and Sexual Activity  . Alcohol use: No    Alcohol/week: 0.0 oz    Comment: 12-07-15 per pt no  . Drug use: No    Comment: 12-07-2015 per pt no  . Sexual activity: Yes  Other Topics Concern  . None  Social History Narrative  . None    Allergies: No Known Allergies  Metabolic Disorder Labs: No results found for: HGBA1C, MPG No results found for: PROLACTIN No results found for: CHOL, TRIG, HDL, CHOLHDL, VLDL, LDLCALC No results found for: TSH  Therapeutic Level Labs: No results found for: LITHIUM No results found for: VALPROATE No components found for:  CBMZ  Current Medications: Current Outpatient Medications  Medication Sig Dispense Refill  . acyclovir (ZOVIRAX) 200 MG capsule Take 200 mg by mouth 2 (two) times daily. Reported on 01/05/2016  3  . ALPRAZolam (XANAX) 0.5 MG tablet Take 0.5 tablets (0.25 mg total) by mouth 2 (two) times daily. 30 tablet 2  . amphetamine-dextroamphetamine (ADDERALL) 30 MG tablet Take 1 tablet by mouth 2 (two) times daily. 60 tablet 0  . amphetamine-dextroamphetamine (ADDERALL) 30 MG tablet Take 1 tablet by mouth 2 (two) times daily. 60 tablet 0  . FLUoxetine (PROZAC) 20 MG capsule Take 1 capsule (20 mg total) by mouth daily. 30 capsule 2  . lamoTRIgine (LAMICTAL) 25 MG tablet Take 25 mg daily for one week, add one pill a week until up to 2 pills twice a day 120 tablet 2  . losartan (COZAAR) 25 MG tablet Take 1 tablet (25 mg total) by mouth daily. 30 tablet 6   No current facility-administered medications for this visit.      Musculoskeletal: Strength & Muscle Tone: within normal limits Gait & Station: normal Patient leans: N/A  Psychiatric Specialty Exam: Review of Systems  All other systems reviewed and  are negative.   Blood pressure 133/82, pulse 84, height 5\' 5"  (1.651 m), weight 144 lb (65.3 kg), SpO2 100 %.Body mass index is 23.96 kg/m.  General Appearance: Casual, Neat and Well Groomed  Eye Contact:  Good  Speech:  Clear and Coherent  Volume:  Normal  Mood:  Dysphoric  Affect:  Congruent  Thought Process:  Goal Directed  Orientation:  Full (Time, Place, and Person)  Thought Content: Rumination   Suicidal Thoughts:  No  Homicidal Thoughts:  No  Memory:  Immediate;   Good Recent;   Good Remote;   Good  Judgement:  Good  Insight:  Good  Psychomotor Activity:  Normal  Concentration:  Concentration: Good and Attention Span: Good  Recall:  Good  Fund of Knowledge: Good  Language: Good  Akathisia:  No  Handed:  Right  AIMS (if indicated): not done  Assets:  Communication Skills Desire for Improvement Physical Health Resilience Social Support Talents/Skills  ADL's:  Intact  Cognition: WNL  Sleep:  Good   Screenings:   Assessment and Plan: Patient is a 37 year old male with a history of depression anxiety and ADHD.  The depression is somewhat better but he does have being rapidly shifting moods at times.  He does not relate this to the Adderall wearing off.  I suggested we start Lamictal at 25 mg and gradually work up to 100 mg over the next 4 weeks.  He will continue Adderall 30 mg twice a day for ADHD and Prozac 20 mg daily for depression.  He will return to see me in 4 weeks   [name], MD 10/24/2017, 10:00 AM

## 2017-11-21 ENCOUNTER — Ambulatory Visit (HOSPITAL_COMMUNITY): Payer: 59 | Admitting: Psychiatry

## 2017-11-25 ENCOUNTER — Ambulatory Visit (HOSPITAL_COMMUNITY): Payer: 59 | Admitting: Psychiatry

## 2017-11-25 ENCOUNTER — Encounter (HOSPITAL_COMMUNITY): Payer: Self-pay | Admitting: Psychiatry

## 2017-11-25 VITALS — BP 116/76 | HR 83 | Ht 65.0 in | Wt 142.0 lb

## 2017-11-25 DIAGNOSIS — F900 Attention-deficit hyperactivity disorder, predominantly inattentive type: Secondary | ICD-10-CM | POA: Diagnosis not present

## 2017-11-25 DIAGNOSIS — F321 Major depressive disorder, single episode, moderate: Secondary | ICD-10-CM | POA: Diagnosis not present

## 2017-11-25 DIAGNOSIS — Z87891 Personal history of nicotine dependence: Secondary | ICD-10-CM | POA: Diagnosis not present

## 2017-11-25 MED ORDER — AMPHETAMINE-DEXTROAMPHETAMINE 30 MG PO TABS: 30.0000 mg | ORAL_TABLET | Freq: Two times a day (BID) | ORAL | 0 refills | Status: DC

## 2017-11-25 NOTE — Progress Notes (Signed)
BH MD/PA/NP OP Progress Note  11/25/2017 8:30 AM Jason [name]  MRN:  [id number]  Chief Complaint:  Chief Complaint    Depression; Anxiety; Follow-up     HPI: This patient is a 37 year old married white male who lives [place]. They have no children. He iscurrently working in a warehouse  The patient was referred by his primary physician, Dr. Selinda FlavinKevin [name] for further assessment of depression and anxiety.  The patient states that he is always been a little bit on the depressive side and anxious person who is shy. However over the last 7 or 8 months he is become excessively anxious and worried. He wakes up with a feeling of dread and it doesn't go away. He's had little drive motivation or interest in doing anything that he usually enjoys. He's had several stressors. His wife has been very sick and has no energy and may have MS or another autoimmune disorder. Her doctors have not been able to figure out what is wrong. She does very little other than sleep all day.  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 He becomes anxious shy and panicky. His company was bought by a new firm and they are expecting a lot more out of him which is made him very anxious. He states that his eyes have difficulty with social anxiety. But lately he's had trouble even talking to his friends and just "can't come up with anything to say." He feels very awkward and self-conscious much of the time.  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 He also enjoys painting. He has various projects that he just can't complete. His mind races and he can't decide what to do first. He often feels depressed and sad and feels like crying but can't. His energy is low. He denies suicidal ideation. He eats well and exercises 4-5 times a week. He doesn't use  drugs and drinks very little. He sleeps very well but always feels tired. He has been tried on several antidepressants. Zoloft helped but made him very drowsy. Prozac and Celexa did not help and Wellbutrin made him feel wired. He's currently on a low dose of clonazepam but it makes him drowsy as well. He's never had past psychiatric treatment or counseling  The patient returns after 4 weeks.  Last time he was complaining of a lot of mood swings so we added Lamictal.  He is up to 25 mg twice a day and is working towards 100 mg a day.  So far he seen some improvement in his mood swings and he is actually stopped taking the Prozac a few days ago to see if he can do without it.  So far he is doing well.  I explained that Prozac can take about 10 days to get out of the body so we will have to see.  He is focusing well with the Adderall and his anxiety is well controlled with Xanax.  He is sleeping well and his energy is good.  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 Major depressive disorder, single episode, moderate with anxious distress (HCC) F32.1   2. Attention deficit hyperactivity disorder (ADHD), predominantly inattentive type F90.0  Past Psychiatric History: none  Past Medical History:  Past Medical History:  Diagnosis Date  . Anxiety disorder   . Elevated cholesterol   . Major depressive disorder    History reviewed. No pertinent surgical history.  Family Psychiatric History: none  Family History:  Family History  Problem Relation Age of Onset  . Hypertension Mother   . Hyperlipidemia Mother   . Hypertension Father   . Heart attack Paternal Grandfather     Social History:  Social History   Socioeconomic History  . Marital status: Married    Spouse name: Not on file  . Number of children: Not on file  . Years of education: Not on file  . Highest education level: Not on file   Occupational History  . Not on file  Social Needs  . Financial resource strain: Not on file  . Food insecurity:    Worry: Not on file    Inability: Not on file  . Transportation needs:    Medical: Not on file    Non-medical: Not on file  Tobacco Use  . Smoking status: Former Games developer  . Smokeless tobacco: Never Used  . Tobacco comment: Quit 06-2008  Substance and Sexual Activity  . Alcohol use: No    Alcohol/week: 0.0 oz    Comment: 12-07-15 per pt no  . Drug use: No    Comment: 12-07-2015 per pt no  . Sexual activity: Yes  Lifestyle  . Physical activity:    Days per week: Not on file    Minutes per session: Not on file  . Stress: Not on file  Relationships  . Social connections:    Talks on phone: Not on file    Gets together: Not on file    Attends religious service: Not on file    Active member of club or organization: Not on file    Attends meetings of clubs or organizations: Not on file    Relationship status: Not on file  Other Topics Concern  . Not on file  Social History Narrative  . Not on file    Allergies: No Known Allergies  Metabolic Disorder Labs: No results found for: HGBA1C, MPG No results found for: PROLACTIN No results found for: CHOL, TRIG, HDL, CHOLHDL, VLDL, LDLCALC No results found for: TSH  Therapeutic Level Labs: No results found for: LITHIUM No results found for: VALPROATE No components found for:  CBMZ  Current Medications: Current Outpatient Medications  Medication Sig Dispense Refill  . acyclovir (ZOVIRAX) 200 MG capsule Take 200 mg by mouth 2 (two) times daily. Reported on 01/05/2016  3  . ALPRAZolam (XANAX) 0.5 MG tablet Take 0.5 tablets (0.25 mg total) by mouth 2 (two) times daily. 30 tablet 2  . amphetamine-dextroamphetamine (ADDERALL) 30 MG tablet Take 1 tablet by mouth 2 (two) times daily. 60 tablet 0  . amphetamine-dextroamphetamine (ADDERALL) 30 MG tablet Take 1 tablet by mouth 2 (two) times daily. 60 tablet 0  . FLUoxetine  (PROZAC) 20 MG capsule Take 1 capsule (20 mg total) by mouth daily. 30 capsule 2  . lamoTRIgine (LAMICTAL) 25 MG tablet Take 25 mg daily for one week, add one pill a week until up to 2 pills twice a day 120 tablet 2  . losartan (COZAAR) 25 MG tablet Take 1 tablet (25 mg total) by mouth daily. 30 tablet 6   No current facility-administered medications for this visit.      Musculoskeletal: Strength & Muscle Tone: within normal limits Gait &  Station: normal Patient leans: N/A  Psychiatric Specialty Exam: Review of Systems  All other systems reviewed and are negative.   Blood pressure 116/76, pulse 83, height 5\' 5"  (1.651 m), weight 142 lb (64.4 kg), SpO2 99 %.Body mass index is 23.63 kg/m.  General Appearance: Casual and Fairly Groomed  Eye Contact:  Good  Speech:  Clear and Coherent  Volume:  Normal  Mood:  Euthymic  Affect:  Appropriate  Thought Process:  Goal Directed  Orientation:  Full (Time, Place, and Person)  Thought Content: WDL   Suicidal Thoughts:  No  Homicidal Thoughts:  No  Memory:  Immediate;   Good Recent;   Good Remote;   Good  Judgement:  Good  Insight:  Good  Psychomotor Activity:  Normal  Concentration:  Concentration: Good and Attention Span: Good  Recall:  Good  Fund of Knowledge: Good  Language: Good  Akathisia:  No  Handed:  Right  AIMS (if indicated): not done  Assets:  Communication Skills Desire for Improvement Physical Health Resilience Social Support Talents/Skills  ADL's:  Intact  Cognition: WNL  Sleep:  Good   Screenings:   Assessment and Plan: This patient is a 37 year old male with a history of depression anxiety and ADD.  Last time he complained of mood swings and the Lamictal seems to be helping.  He will continue to work towards 100 mg daily.  He will continue Xanax 0.25 mg twice a day as needed for anxiety and Adderall 30 mg twice a day for focus.  He will return to see me in 2 months   [name], MD 11/25/2017, 8:30 AM

## 2017-12-26 DIAGNOSIS — R300 Dysuria: Secondary | ICD-10-CM | POA: Diagnosis not present

## 2017-12-26 DIAGNOSIS — M25532 Pain in left wrist: Secondary | ICD-10-CM | POA: Diagnosis not present

## 2017-12-26 DIAGNOSIS — Z7689 Persons encountering health services in other specified circumstances: Secondary | ICD-10-CM | POA: Diagnosis not present

## 2017-12-26 DIAGNOSIS — M25531 Pain in right wrist: Secondary | ICD-10-CM | POA: Diagnosis not present

## 2017-12-26 DIAGNOSIS — Z6821 Body mass index (BMI) 21.0-21.9, adult: Secondary | ICD-10-CM | POA: Diagnosis not present

## 2018-01-27 ENCOUNTER — Ambulatory Visit (HOSPITAL_COMMUNITY): Payer: 59 | Admitting: Psychiatry

## 2018-02-03 ENCOUNTER — Ambulatory Visit (HOSPITAL_COMMUNITY): Payer: 59 | Admitting: Psychiatry

## 2018-02-03 ENCOUNTER — Encounter (HOSPITAL_COMMUNITY): Payer: Self-pay | Admitting: Psychiatry

## 2018-02-03 VITALS — BP 128/85 | HR 78 | Ht 65.0 in | Wt 145.0 lb

## 2018-02-03 DIAGNOSIS — F321 Major depressive disorder, single episode, moderate: Secondary | ICD-10-CM | POA: Diagnosis not present

## 2018-02-03 DIAGNOSIS — F900 Attention-deficit hyperactivity disorder, predominantly inattentive type: Secondary | ICD-10-CM

## 2018-02-03 DIAGNOSIS — Z87891 Personal history of nicotine dependence: Secondary | ICD-10-CM | POA: Diagnosis not present

## 2018-02-03 MED ORDER — AMPHETAMINE-DEXTROAMPHETAMINE 30 MG PO TABS: 30.0000 mg | ORAL_TABLET | Freq: Two times a day (BID) | ORAL | 0 refills | Status: DC

## 2018-02-03 MED ORDER — LAMOTRIGINE 100 MG PO TABS: 100.0000 mg | ORAL_TABLET | Freq: Every day | ORAL | 2 refills | Status: DC

## 2018-02-03 MED ORDER — FLUOXETINE HCL 20 MG PO CAPS: 20.0000 mg | ORAL_CAPSULE | Freq: Every day | ORAL | 2 refills | Status: DC

## 2018-02-03 MED ORDER — ALPRAZOLAM 0.5 MG PO TABS: 0.2500 mg | ORAL_TABLET | Freq: Two times a day (BID) | ORAL | 2 refills | Status: DC

## 2018-02-03 NOTE — Progress Notes (Signed)
BH MD/PA/NP OP Progress Note  02/03/2018 9:06 AM Jason [name]  MRN:  [id number]  Chief Complaint:  Chief Complaint    Depression; Anxiety; ADHD; Follow-up     HPI: This patient is a 37 year old married white male who lives with his wife in [place]. They have no children. He iscurrently working in a warehouse  The patient was referred by his primary physician, Dr. Selinda Flavin for further assessment of depression and anxiety.  The patient states that he is always been a little bit on the depressive side and anxious person who is shy. However over the last 7 or 8 months he is become excessively anxious and worried. He wakes up with a feeling of dread and it doesn't go away. He's had little drive motivation or interest in doing anything that he usually enjoys. He's had several stressors. His wife has been very sick and has no energy and may have MS or another autoimmune disorder. Her doctors have not been able to figure out what is wrong. She does very little other than sleep all day.  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 He becomes anxious shy and panicky. His company was bought by a new firm and they are expecting a lot more out of him which is made him very anxious. He states that his eyes have difficulty with social anxiety. But lately he's had trouble even talking to his friends and just "can't come up with anything to say." He feels very awkward and self-conscious much of the time.  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 He also enjoys painting. He has various projects that he just can't complete. His mind races and he can't decide what to do first. He often feels depressed and sad and feels like crying but can't. His energy is low. He denies suicidal ideation. He eats well and exercises 4-5 times a week. He doesn't  use drugs and drinks very little. He sleeps very well but always feels tired. He has been tried on several antidepressants. Zoloft helped but made him very drowsy. Prozac and Celexa did not help and Wellbutrin made him feel wired. He's currently on a low dose of clonazepam but it makes him drowsy as well. He's never had past psychiatric treatment or counseling  The patient returns after 2 months.  Last time we added Lamictal to his regimen and he is up to 100 mg daily.  He feels that it is helped his mood swings.  He tried going off Prozac but he got very depressed and went back on it.  His mood is good now.  His focus is good with the Adderall and anxiety is well controlled with Xanax.  He really has no complaints.  He still working a lot of hours but trying to fit in music when he can.  He denies suicidal ideation crying spells or sadness. Visit Diagnosis:    ICD-10-CM   1. Major depressive disorder, single episode, moderate with anxious distress (HCC) F32.1   2. Attention deficit hyperactivity disorder (ADHD), predominantly inattentive type F90.0     Past Psychiatric History: none  Past Medical History:  Past Medical History:  Diagnosis Date  . Anxiety disorder   . Elevated cholesterol   . Major depressive disorder    History reviewed. No pertinent surgical history.  Family Psychiatric History:  none  Family History:  Family History  Problem Relation Age of Onset  . Hypertension Mother   . Hyperlipidemia Mother   . Hypertension Father   . Heart attack Paternal Grandfather     Social History:  Social History   Socioeconomic History  . Marital status: Married    Spouse name: Not on file  . Number of children: Not on file  . Years of education: Not on file  . Highest education level: Not on file  Occupational History  . Not on file  Social Needs  . Financial resource strain: Not on file  . Food insecurity:    Worry: Not on file    Inability: Not on file  . Transportation  needs:    Medical: Not on file    Non-medical: Not on file  Tobacco Use  . Smoking status: Former Games developermoker  . Smokeless tobacco: Never Used  . Tobacco comment: Quit 06-2008  Substance and Sexual Activity  . Alcohol use: No    Alcohol/week: 0.0 oz    Comment: 12-07-15 per pt no  . Drug use: No    Comment: 12-07-2015 per pt no  . Sexual activity: Yes  Lifestyle  . Physical activity:    Days per week: Not on file    Minutes per session: Not on file  . Stress: Not on file  Relationships  . Social connections:    Talks on phone: Not on file    Gets together: Not on file    Attends religious service: Not on file    Active member of club or organization: Not on file    Attends meetings of clubs or organizations: Not on file    Relationship status: Not on file  Other Topics Concern  . Not on file  Social History Narrative  . Not on file    Allergies: No Known Allergies  Metabolic Disorder Labs: No results found for: HGBA1C, MPG No results found for: PROLACTIN No results found for: CHOL, TRIG, HDL, CHOLHDL, VLDL, LDLCALC No results found for: TSH  Therapeutic Level Labs: No results found for: LITHIUM No results found for: VALPROATE No components found for:  CBMZ  Current Medications: Current Outpatient Medications  Medication Sig Dispense Refill  . acyclovir (ZOVIRAX) 200 MG capsule Take 200 mg by mouth 2 (two) times daily. Reported on 01/05/2016  3  . ALPRAZolam (XANAX) 0.5 MG tablet Take 0.5 tablets (0.25 mg total) by mouth 2 (two) times daily. 30 tablet 2  . amphetamine-dextroamphetamine (ADDERALL) 30 MG tablet Take 1 tablet by mouth 2 (two) times daily. 60 tablet 0  . amphetamine-dextroamphetamine (ADDERALL) 30 MG tablet Take 1 tablet by mouth 2 (two) times daily. 60 tablet 0  . FLUoxetine (PROZAC) 20 MG capsule Take 1 capsule (20 mg total) by mouth daily. 30 capsule 2  . amphetamine-dextroamphetamine (ADDERALL) 30 MG tablet Take 1 tablet by mouth 2 (two) times daily. 90  tablet 0  . lamoTRIgine (LAMICTAL) 100 MG tablet Take 1 tablet (100 mg total) by mouth daily. 30 tablet 2  . losartan (COZAAR) 25 MG tablet Take 1 tablet (25 mg total) by mouth daily. 30 tablet 6   No current facility-administered medications for this visit.      Musculoskeletal: Strength & Muscle Tone: within normal limits Gait & Station: normal Patient leans: N/A  Psychiatric Specialty Exam: Review of Systems  All other systems reviewed and are negative.   Blood pressure 128/85, pulse 78, height 5\' 5"  (1.651 m), weight 145 lb (  65.8 kg), SpO2 98 %.Body mass index is 24.13 kg/m.  General Appearance: Casual and Fairly Groomed  Eye Contact:  Good  Speech:  Clear and Coherent  Volume:  Normal  Mood:  Euthymic  Affect:  Congruent  Thought Process:  Goal Directed  Orientation:  Full (Time, Place, and Person)  Thought Content: WDL   Suicidal Thoughts:  No  Homicidal Thoughts:  No  Memory:  Immediate;   Good Recent;   Good Remote;   Good  Judgement:  Good  Insight:  Fair  Psychomotor Activity:  Normal  Concentration:  Concentration: Good and Attention Span: Good  Recall:  Good  Fund of Knowledge: Good  Language: Good  Akathisia:  No  Handed:  Right  AIMS (if indicated): not done  Assets:  Communication Skills Desire for Improvement Physical Health Resilience Social Support Talents/Skills  ADL's:  Intact  Cognition: WNL  Sleep:  Good   Screenings:   Assessment and Plan: This patient is a 37 year old male with a history of anxiety depression and mood swings and ADHD.  He is doing well on his current regimen.  He will continue Prozac 20 mg daily for depression, Lamictal 100 mg daily for mood stabilization, Adderall 30 mg twice daily for focus and Xanax 0.5 mg 2 times daily as needed for anxiety.  He will return to see me in 3 months   [name], MD 02/03/2018, 9:06 AM

## 2018-04-29 ENCOUNTER — Other Ambulatory Visit (HOSPITAL_COMMUNITY): Payer: Self-pay | Admitting: Psychiatry

## 2018-05-06 ENCOUNTER — Ambulatory Visit (HOSPITAL_COMMUNITY): Payer: Self-pay | Admitting: Psychiatry

## 2018-05-06 ENCOUNTER — Encounter (HOSPITAL_COMMUNITY): Payer: Self-pay | Admitting: Psychiatry

## 2018-05-06 ENCOUNTER — Ambulatory Visit (HOSPITAL_COMMUNITY): Payer: 59 | Admitting: Psychiatry

## 2018-05-06 VITALS — BP 138/92 | HR 76 | Ht 65.0 in | Wt 142.0 lb

## 2018-05-06 DIAGNOSIS — Z87891 Personal history of nicotine dependence: Secondary | ICD-10-CM

## 2018-05-06 DIAGNOSIS — F900 Attention-deficit hyperactivity disorder, predominantly inattentive type: Secondary | ICD-10-CM | POA: Diagnosis not present

## 2018-05-06 DIAGNOSIS — F321 Major depressive disorder, single episode, moderate: Secondary | ICD-10-CM

## 2018-05-06 DIAGNOSIS — Z79899 Other long term (current) drug therapy: Secondary | ICD-10-CM | POA: Diagnosis not present

## 2018-05-06 MED ORDER — FLUOXETINE HCL 20 MG PO CAPS: 20.0000 mg | ORAL_CAPSULE | Freq: Every day | ORAL | 2 refills | Status: DC

## 2018-05-06 MED ORDER — AMPHETAMINE-DEXTROAMPHETAMINE 30 MG PO TABS: 30.0000 mg | ORAL_TABLET | Freq: Two times a day (BID) | ORAL | 0 refills | Status: DC

## 2018-05-06 MED ORDER — LAMOTRIGINE 100 MG PO TABS: 100.0000 mg | ORAL_TABLET | Freq: Every day | ORAL | 2 refills | Status: DC

## 2018-05-06 MED ORDER — ALPRAZOLAM 0.5 MG PO TABS: 0.2500 mg | ORAL_TABLET | Freq: Two times a day (BID) | ORAL | 2 refills | Status: DC

## 2018-05-06 NOTE — Progress Notes (Signed)
BH MD/PA/NP OP Progress Note  05/06/2018 3:36 PM Jason [name]  MRN:  [id number]  Chief Complaint:  Chief Complaint    Depression; Anxiety; Follow-up     HPI: This patient is a 37 year old married white male who lives with his wife in [place]. They have no children. He iscurrently working in a warehouse  The patient was referred by his primary physician, Dr. Selinda Flavin for further assessment of depression and anxiety.  The patient states that he is always been a little bit on the depressive side and anxious person who is shy. However over the last 7 or 8 months he is become excessively anxious and worried. He wakes up with a feeling of dread and it doesn't go away. He's had little drive motivation or interest in doing anything that he usually enjoys. He's had several stressors. His wife has been very sick and has no energy and may have MS or another autoimmune disorder. Her doctors have not been able to figure out what is wrong. She does very little other than sleep all day.  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 He becomes anxious shy and panicky. His company was bought by a new firm and they are expecting a lot more out of him which is made him very anxious. He states that his eyes have difficulty with social anxiety. But lately he's had trouble even talking to his friends and just "can't come up with anything to say." He feels very awkward and self-conscious much of the time.  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 He also enjoys painting. He has various projects that he just can't complete. His mind races and he can't decide what to do first. He often feels depressed and sad and feels like crying but can't. His energy is low. He denies suicidal ideation. He eats well and exercises 4-5 times a week. He doesn't use  drugs and drinks very little. He sleeps very well but always feels tired. He has been tried on several antidepressants. Zoloft helped but made him very drowsy. Prozac and Celexa did not help and Wellbutrin made him feel wired. He's currently on a low dose of clonazepam but it makes him drowsy as well. He's never had past psychiatric treatment or counseling  The patient returns after 3 months.  He states that everything is going well.  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  He gets time off work he can work on his music.  He states that his mood is good he is sleeping well and he denies being seriously depressed or anxious.  He denies any current mood swings. Visit Diagnosis:    ICD-10-CM   1. Major depressive disorder, single episode, moderate with anxious distress (HCC) F32.1   2. Attention deficit hyperactivity disorder (ADHD), predominantly inattentive type F90.0     Past Psychiatric History: none  Past Medical History:  Past Medical History:  Diagnosis Date  . Anxiety disorder   . Elevated cholesterol   . Major depressive disorder    History reviewed. No pertinent surgical history.  Family Psychiatric History: none  Family History:  Family History  Problem Relation Age of Onset  . Hypertension Mother   . Hyperlipidemia Mother   . Hypertension Father   .  Heart attack Paternal Grandfather     Social History:  Social History   Socioeconomic History  . Marital status: Married    Spouse name: Not on file  . Number of children: Not on file  . Years of education: Not on file  . Highest education level: Not on file  Occupational History  . Not on file  Social Needs  . Financial resource strain: Not on file  . Food insecurity:    Worry: Not on file    Inability: Not on file  . Transportation needs:    Medical: Not on file    Non-medical: Not on file  Tobacco Use  . Smoking status: Former Games developer  . Smokeless tobacco:  Never Used  . Tobacco comment: Quit 06-2008  Substance and Sexual Activity  . Alcohol use: No    Alcohol/week: 0.0 standard drinks    Comment: 12-07-15 per pt no  . Drug use: No    Comment: 12-07-2015 per pt no  . Sexual activity: Yes  Lifestyle  . Physical activity:    Days per week: Not on file    Minutes per session: Not on file  . Stress: Not on file  Relationships  . Social connections:    Talks on phone: Not on file    Gets together: Not on file    Attends religious service: Not on file    Active member of club or organization: Not on file    Attends meetings of clubs or organizations: Not on file    Relationship status: Not on file  Other Topics Concern  . Not on file  Social History Narrative  . Not on file    Allergies: No Known Allergies  Metabolic Disorder Labs: No results found for: HGBA1C, MPG No results found for: PROLACTIN No results found for: CHOL, TRIG, HDL, CHOLHDL, VLDL, LDLCALC No results found for: TSH  Therapeutic Level Labs: No results found for: LITHIUM No results found for: VALPROATE No components found for:  CBMZ  Current Medications: Current Outpatient Medications  Medication Sig Dispense Refill  . acyclovir (ZOVIRAX) 200 MG capsule Take 200 mg by mouth 2 (two) times daily. Reported on 01/05/2016  3  . ALPRAZolam (XANAX) 0.5 MG tablet Take 0.5 tablets (0.25 mg total) by mouth 2 (two) times daily. 30 tablet 2  . amphetamine-dextroamphetamine (ADDERALL) 30 MG tablet Take 1 tablet by mouth 2 (two) times daily. 60 tablet 0  . amphetamine-dextroamphetamine (ADDERALL) 30 MG tablet Take 1 tablet by mouth 2 (two) times daily. 60 tablet 0  . amphetamine-dextroamphetamine (ADDERALL) 30 MG tablet Take 1 tablet by mouth 2 (two) times daily. 90 tablet 0  . FLUoxetine (PROZAC) 20 MG capsule Take 1 capsule (20 mg total) by mouth daily. 30 capsule 2  . lamoTRIgine (LAMICTAL) 100 MG tablet Take 1 tablet (100 mg total) by mouth daily. 30 tablet 2  . losartan  (COZAAR) 25 MG tablet Take 1 tablet (25 mg total) by mouth daily. 30 tablet 6   No current facility-administered medications for this visit.      Musculoskeletal: Strength & Muscle Tone: within normal limits Gait & Station: normal Patient leans: N/A  Psychiatric Specialty Exam: Review of Systems  All other systems reviewed and are negative.   Blood pressure (!) 138/92, pulse 76, height 5\' 5"  (1.651 m), weight 142 lb (64.4 kg), SpO2 97 %.Body mass index is 23.63 kg/m.  General Appearance: Casual and Fairly Groomed  Eye Contact:  Good  Speech:  Clear and Coherent  Volume:  Normal  Mood:  Euthymic  Affect:  Congruent  Thought Process:  Goal Directed  Orientation:  Full (Time, Place, and Person)  Thought Content: WDL   Suicidal Thoughts:  No  Homicidal Thoughts:  No  Memory:  Immediate;   Good Recent;   Good Remote;   Fair  Judgement:  Good  Insight:  Good  Psychomotor Activity:  Normal  Concentration:  Concentration: Good and Attention Span: Good  Recall:  Good  Fund of Knowledge: Good  Language: Good  Akathisia:  No  Handed:  Right  AIMS (if indicated): not done  Assets:  Communication Skills Desire for Improvement Physical Health Resilience Social Support Talents/Skills  ADL's:  Intact  Cognition: WNL  Sleep:  Good   Screenings:   Assessment and Plan: This patient is a 37 year old male with a history of depression anxiety irritability and ADHD.  He is doing well on his current regimen.  He will continue Lamictal 100 mg daily for mood stabilization, Prozac 20 mg daily for depression, Adderall 30 mg twice daily for ADHD and Xanax 0.25 mg twice daily for anxiety.  He will return to see me in 3 months   [name], MD 05/06/2018, 3:36 PM

## 2018-05-15 ENCOUNTER — Ambulatory Visit (INDEPENDENT_AMBULATORY_CARE_PROVIDER_SITE_OTHER): Payer: 59 | Admitting: Otolaryngology

## 2018-05-15 DIAGNOSIS — K219 Gastro-esophageal reflux disease without esophagitis: Secondary | ICD-10-CM

## 2018-05-15 DIAGNOSIS — H903 Sensorineural hearing loss, bilateral: Secondary | ICD-10-CM

## 2018-05-15 DIAGNOSIS — K1121 Acute sialoadenitis: Secondary | ICD-10-CM

## 2018-05-15 DIAGNOSIS — R1312 Dysphagia, oropharyngeal phase: Secondary | ICD-10-CM

## 2018-06-26 DIAGNOSIS — Z23 Encounter for immunization: Secondary | ICD-10-CM | POA: Diagnosis not present

## 2018-06-26 DIAGNOSIS — R5383 Other fatigue: Secondary | ICD-10-CM | POA: Diagnosis not present

## 2018-07-27 ENCOUNTER — Other Ambulatory Visit (HOSPITAL_COMMUNITY): Payer: Self-pay | Admitting: Psychiatry

## 2018-08-06 ENCOUNTER — Ambulatory Visit (HOSPITAL_COMMUNITY): Payer: Self-pay | Admitting: Psychiatry

## 2018-08-07 ENCOUNTER — Encounter (HOSPITAL_COMMUNITY): Payer: Self-pay | Admitting: Psychiatry

## 2018-08-07 ENCOUNTER — Ambulatory Visit (HOSPITAL_COMMUNITY): Payer: 59 | Admitting: Psychiatry

## 2018-08-07 VITALS — BP 136/82 | HR 88 | Ht 65.0 in | Wt 144.0 lb

## 2018-08-07 DIAGNOSIS — F900 Attention-deficit hyperactivity disorder, predominantly inattentive type: Secondary | ICD-10-CM

## 2018-08-07 DIAGNOSIS — F321 Major depressive disorder, single episode, moderate: Secondary | ICD-10-CM | POA: Diagnosis not present

## 2018-08-07 MED ORDER — LAMOTRIGINE 100 MG PO TABS: 100.0000 mg | ORAL_TABLET | Freq: Every day | ORAL | 2 refills | Status: DC

## 2018-08-07 MED ORDER — AMPHETAMINE-DEXTROAMPHETAMINE 30 MG PO TABS: 30.0000 mg | ORAL_TABLET | Freq: Two times a day (BID) | ORAL | 0 refills | Status: DC

## 2018-08-07 MED ORDER — FLUOXETINE HCL 20 MG PO CAPS: 20.0000 mg | ORAL_CAPSULE | Freq: Two times a day (BID) | ORAL | 2 refills | Status: DC

## 2018-08-07 MED ORDER — ALPRAZOLAM 0.5 MG PO TABS: 0.5000 mg | ORAL_TABLET | Freq: Two times a day (BID) | ORAL | 2 refills | Status: DC

## 2018-08-07 NOTE — Progress Notes (Signed)
BH MD/PA/NP OP Progress Note  08/07/2018 10:07 AM Jason [name]  MRN:  [id number]  Chief Complaint:  Chief Complaint    Depression; Anxiety; Follow-up     HPI: This patient is a 37 year old married white male who lives with his wife in [place]. They have no children. He iscurrently working in a warehouse  The patient was referred by his primary physician, Dr. Selinda Flavin for further assessment of depression and anxiety.  The patient states that he is always been a little bit on the depressive side and anxious person who is shy. However over the last 7 or 8 months he is become excessively anxious and worried. He wakes up with a feeling of dread and it doesn't go away. He's had little drive motivation or interest in doing anything that he usually enjoys. He's had several stressors. His wife has been very sick and has no energy and may have MS or another autoimmune disorder. Her doctors have not been able to figure out what is wrong. She does very little other than sleep all day.  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 He becomes anxious shy and panicky. His company was bought by a new firm and they are expecting a lot more out of him which is made him very anxious. He states that his eyes have difficulty with social anxiety. But lately he's had trouble even talking to his friends and just "can't come up with anything to say." He feels very awkward and self-conscious much of the time.  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 He also enjoys painting. He has various projects that he just can't complete. His mind races and he can't decide what to do first. He often feels depressed and sad and feels like crying but can't. His energy is low. He denies suicidal ideation. He eats well and exercises 4-5 times a week. He doesn't use  drugs and drinks very little. He sleeps very well but always feels tired. He has been tried on several antidepressants. Zoloft helped but made him very drowsy. Prozac and Celexa did not help and Wellbutrin made him feel wired. He's currently on a low dose of clonazepam but it makes him drowsy as well. He's never had past psychiatric treatment or counseling  The patient returns after 3 months.  He states that he is working hard at [employer].  He states that he has been a little bit more depressed and anxious lately and it might just be the winter months and the holidays.  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  I suggested going up a little bit on his Xanax as well as the Prozac and he agrees.  He denies being suicidal.  He is getting enough sleep.  He denies any new stressors at home.  He is still making time to do music. Visit Diagnosis:    ICD-10-CM   1. Major depressive disorder, single episode, moderate with anxious distress (HCC) F32.1   2. Attention deficit hyperactivity disorder (ADHD), predominantly inattentive type F90.0     Past Psychiatric History: none  Past Medical History:  Past Medical History:  Diagnosis Date  . Anxiety disorder   . Elevated cholesterol   . Major depressive disorder    History reviewed. No  pertinent surgical history.  Family Psychiatric History: none  Family History:  Family History  Problem Relation Age of Onset  . Hypertension Mother   . Hyperlipidemia Mother   . Hypertension Father   . Heart attack Paternal Grandfather     Social History:  Social History   Socioeconomic History  . Marital status: Married    Spouse name: Not on file  . Number of children: Not on file  . Years of education: Not on file  . Highest education level: Not on file  Occupational History  . Not on file  Social Needs  . Financial resource strain: Not on file  . Food insecurity:    Worry: Not on file     Inability: Not on file  . Transportation needs:    Medical: Not on file    Non-medical: Not on file  Tobacco Use  . Smoking status: Former Games developermoker  . Smokeless tobacco: Never Used  . Tobacco comment: Quit 06-2008  Substance and Sexual Activity  . Alcohol use: No    Alcohol/week: 0.0 standard drinks    Comment: 12-07-15 per pt no  . Drug use: No    Comment: 12-07-2015 per pt no  . Sexual activity: Yes  Lifestyle  . Physical activity:    Days per week: Not on file    Minutes per session: Not on file  . Stress: Not on file  Relationships  . Social connections:    Talks on phone: Not on file    Gets together: Not on file    Attends religious service: Not on file    Active member of club or organization: Not on file    Attends meetings of clubs or organizations: Not on file    Relationship status: Not on file  Other Topics Concern  . Not on file  Social History Narrative  . Not on file    Allergies: No Known Allergies  Metabolic Disorder Labs: No results found for: HGBA1C, MPG No results found for: PROLACTIN No results found for: CHOL, TRIG, HDL, CHOLHDL, VLDL, LDLCALC No results found for: TSH  Therapeutic Level Labs: No results found for: LITHIUM No results found for: VALPROATE No components found for:  CBMZ  Current Medications: Current Outpatient Medications  Medication Sig Dispense Refill  . acyclovir (ZOVIRAX) 200 MG capsule Take 200 mg by mouth 2 (two) times daily. Reported on 01/05/2016  3  . ALPRAZolam (XANAX) 0.5 MG tablet Take 1 tablet (0.5 mg total) by mouth 2 (two) times daily. 30 tablet 2  . amphetamine-dextroamphetamine (ADDERALL) 30 MG tablet Take 1 tablet by mouth 2 (two) times daily. 60 tablet 0  . amphetamine-dextroamphetamine (ADDERALL) 30 MG tablet Take 1 tablet by mouth 2 (two) times daily. 60 tablet 0  . amphetamine-dextroamphetamine (ADDERALL) 30 MG tablet Take 1 tablet by mouth 2 (two) times daily. 90 tablet 0  . FLUoxetine (PROZAC) 20 MG  capsule Take 1 capsule (20 mg total) by mouth 2 (two) times daily. 60 capsule 2  . lamoTRIgine (LAMICTAL) 100 MG tablet Take 1 tablet (100 mg total) by mouth daily. 30 tablet 2  . losartan (COZAAR) 25 MG tablet Take 1 tablet (25 mg total) by mouth daily. 30 tablet 6   No current facility-administered medications for this visit.      Musculoskeletal: Strength & Muscle Tone: within normal limits Gait & Station: normal Patient leans: N/A  Psychiatric Specialty Exam: Review of Systems  Psychiatric/Behavioral: Positive for depression. The patient is nervous/anxious.   All  other systems reviewed and are negative.   Blood pressure 136/82, pulse 88, height 5\' 5"  (1.651 m), weight 144 lb (65.3 kg), SpO2 100 %.Body mass index is 23.96 kg/m.  General Appearance: Casual and Fairly Groomed  Eye Contact:  Good  Speech:  Clear and Coherent  Volume:  Normal  Mood:  Anxious and Dysphoric  Affect:  Constricted  Thought Process:  Goal Directed  Orientation:  Full (Time, Place, and Person)  Thought Content: Rumination   Suicidal Thoughts:  No  Homicidal Thoughts:  No  Memory:  Immediate;   Good Recent;   Good Remote;   Good  Judgement:  Good  Insight:  Good  Psychomotor Activity:  Normal  Concentration:  Concentration: Good and Attention Span: Good  Recall:  Good  Fund of Knowledge: Good  Language: Good  Akathisia:  No  Handed:  Right  AIMS (if indicated): not done  Assets:  Communication Skills Desire for Improvement Physical Health Resilience Social Support Talents/Skills  ADL's:  Intact  Cognition: WNL  Sleep:  Good   Screenings:   Assessment and Plan: This patient is a 37 year old male with a history of depression anxiety and difficulty with focus.  He has been a little bit more depressed and anxious lately which may have something to do with the winter months and less sunlight.  However we will increase Prozac to 20 mg twice daily for depression and also increase Xanax to  0.5 mg twice daily for anxiety.  He will continue Lamictal 100 mg daily for mood swings and Adderall 30 mg twice daily for ADHD.  He will return to see me in 2 months or call sooner if needed   [name], MD 08/07/2018, 10:07 AM

## 2018-10-08 ENCOUNTER — Encounter (HOSPITAL_COMMUNITY): Payer: Self-pay | Admitting: Psychiatry

## 2018-10-08 ENCOUNTER — Ambulatory Visit (HOSPITAL_COMMUNITY): Payer: 59 | Admitting: Psychiatry

## 2018-10-08 VITALS — BP 123/82 | HR 76 | Ht 65.0 in | Wt 149.0 lb

## 2018-10-08 DIAGNOSIS — F321 Major depressive disorder, single episode, moderate: Secondary | ICD-10-CM | POA: Diagnosis not present

## 2018-10-08 DIAGNOSIS — F900 Attention-deficit hyperactivity disorder, predominantly inattentive type: Secondary | ICD-10-CM

## 2018-10-08 MED ORDER — LAMOTRIGINE 100 MG PO TABS: 100.0000 mg | ORAL_TABLET | Freq: Every day | ORAL | 2 refills | Status: DC

## 2018-10-08 MED ORDER — AMPHETAMINE-DEXTROAMPHETAMINE 30 MG PO TABS: 30.0000 mg | ORAL_TABLET | Freq: Two times a day (BID) | ORAL | 0 refills | Status: DC

## 2018-10-08 MED ORDER — FLUOXETINE HCL 20 MG PO CAPS: 20.0000 mg | ORAL_CAPSULE | Freq: Two times a day (BID) | ORAL | 2 refills | Status: DC

## 2018-10-08 MED ORDER — ALPRAZOLAM 0.5 MG PO TABS: 0.5000 mg | ORAL_TABLET | Freq: Two times a day (BID) | ORAL | 2 refills | Status: DC

## 2018-10-08 NOTE — Progress Notes (Signed)
BH MD/PA/NP OP Progress Note  10/08/2018 2:08 PM Jason [name]  MRN:  [id number]  Chief Complaint:  Chief Complaint    Depression; Anxiety; ADD; Follow-up     HPI: This patient is a 38 year old married white male who lives with his wife in [place]. They have no children. He iscurrently working in a warehouse  The patient was referred by his primary physician, Dr. Selinda Flavin for further assessment of depression and anxiety.  The patient states that he is always been a little bit on the depressive side and anxious person who is shy. However over the last 7 or 8 months he is become excessively anxious and worried. He wakes up with a feeling of dread and it doesn't go away. He's had little drive motivation or interest in doing anything that he usually enjoys. He's had several stressors. His wife has been very sick and has no energy and may have MS or another autoimmune disorder. Her doctors have not been able to figure out what is wrong. She does very little other than sleep all day.  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 He becomes anxious shy and panicky. His company was bought by a new firm and they are expecting a lot more out of him which is made him very anxious. He states that his eyes have difficulty with social anxiety. But lately he's had trouble even talking to his friends and just "can't come up with anything to say." He feels very awkward and self-conscious much of the time.  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 He also enjoys painting. He has various projects that he just can't complete. His mind races and he can't decide what to do first. He often feels depressed and sad and feels like crying but can't. His energy is low. He denies suicidal ideation. He eats well and exercises 4-5 times a week. He doesn't  use drugs and drinks very little. He sleeps very well but always feels tired. He has been tried on several antidepressants. Zoloft helped but made him very drowsy. Prozac and Celexa did not help and Wellbutrin made him feel wired. He's currently on a low dose of clonazepam but it makes him drowsy as well. He's never had past psychiatric treatment or counseling  The patient returns after 2 months.  Last time we had increased his Prozac and Xanax because he was feeling more anxious and depressed.  He states that he is doing much better now.  His mood has been good his energy is good and he is sleeping well.  He spending a lot of his free time doing home repair projects.  He is sleeping well at night.  He denies any suicidal ideation or panic attacks.   Visit Diagnosis:    ICD-10-CM   1. Major depressive disorder, single episode, moderate with anxious distress (HCC) F32.1   2. Attention deficit hyperactivity disorder (ADHD), predominantly inattentive type F90.0     Past Psychiatric History: none  Past Medical History:  Past Medical History:  Diagnosis Date  . Anxiety disorder   . Elevated cholesterol   . Major depressive disorder    History reviewed. No pertinent surgical history.  Family Psychiatric History: See below  Family History:  Family History  Problem Relation Age of Onset  . Hypertension Mother   . Hyperlipidemia  Mother   . Hypertension Father   . Heart attack Paternal Grandfather     Social History:  Social History   Socioeconomic History  . Marital status: Married    Spouse name: Not on file  . Number of children: Not on file  . Years of education: Not on file  . Highest education level: Not on file  Occupational History  . Not on file  Social Needs  . Financial resource strain: Not on file  . Food insecurity:    Worry: Not on file    Inability: Not on file  . Transportation needs:    Medical: Not on file    Non-medical: Not on file  Tobacco Use  . Smoking  status: Former Games developer  . Smokeless tobacco: Never Used  . Tobacco comment: Quit 06-2008  Substance and Sexual Activity  . Alcohol use: No    Alcohol/week: 0.0 standard drinks    Comment: 12-07-15 per pt no  . Drug use: No    Comment: 12-07-2015 per pt no  . Sexual activity: Yes  Lifestyle  . Physical activity:    Days per week: Not on file    Minutes per session: Not on file  . Stress: Not on file  Relationships  . Social connections:    Talks on phone: Not on file    Gets together: Not on file    Attends religious service: Not on file    Active member of club or organization: Not on file    Attends meetings of clubs or organizations: Not on file    Relationship status: Not on file  Other Topics Concern  . Not on file  Social History Narrative  . Not on file    Allergies: No Known Allergies  Metabolic Disorder Labs: No results found for: HGBA1C, MPG No results found for: PROLACTIN No results found for: CHOL, TRIG, HDL, CHOLHDL, VLDL, LDLCALC No results found for: TSH  Therapeutic Level Labs: No results found for: LITHIUM No results found for: VALPROATE No components found for:  CBMZ  Current Medications: Current Outpatient Medications  Medication Sig Dispense Refill  . acyclovir (ZOVIRAX) 200 MG capsule Take 200 mg by mouth 2 (two) times daily. Reported on 01/05/2016  3  . ALPRAZolam (XANAX) 0.5 MG tablet Take 1 tablet (0.5 mg total) by mouth 2 (two) times daily. 30 tablet 2  . amphetamine-dextroamphetamine (ADDERALL) 30 MG tablet Take 1 tablet by mouth 2 (two) times daily. 60 tablet 0  . amphetamine-dextroamphetamine (ADDERALL) 30 MG tablet Take 1 tablet by mouth 2 (two) times daily. 60 tablet 0  . amphetamine-dextroamphetamine (ADDERALL) 30 MG tablet Take 1 tablet by mouth 2 (two) times daily. 60 tablet 0  . FLUoxetine (PROZAC) 20 MG capsule Take 1 capsule (20 mg total) by mouth 2 (two) times daily. 60 capsule 2  . lamoTRIgine (LAMICTAL) 100 MG tablet Take 1 tablet  (100 mg total) by mouth daily. 30 tablet 2  . losartan (COZAAR) 25 MG tablet Take 1 tablet (25 mg total) by mouth daily. 30 tablet 6   No current facility-administered medications for this visit.      Musculoskeletal: Strength & Muscle Tone: within normal limits Gait & Station: normal Patient leans: N/A  Psychiatric Specialty Exam: Review of Systems  All other systems reviewed and are negative.   Blood pressure 123/82, pulse 76, height 5\' 5"  (1.651 m), weight 149 lb (67.6 kg), SpO2 100 %.Body mass index is 24.79 kg/m.  General Appearance: Casual and Fairly Groomed  Eye Contact:  Good  Speech:  Clear and Coherent  Volume:  Normal  Mood:  Euthymic  Affect:  Congruent  Thought Process:  Goal Directed  Orientation:  Full (Time, Place, and Person)  Thought Content: WDL   Suicidal Thoughts:  No  Homicidal Thoughts:  No  Memory:  Immediate;   Good Recent;   Good Remote;   Good  Judgement:  Good  Insight:  Good  Psychomotor Activity:  Normal  Concentration:  Concentration: Good and Attention Span: Good  Recall:  Good  Fund of Knowledge: Good  Language: Good  Akathisia:  No  Handed:  Right  AIMS (if indicated): not done  Assets:  Communication Skills Desire for Improvement Physical Health Resilience Social Support Talents/Skills  ADL's:  Intact  Cognition: WNL  Sleep:  Good   Screenings:   Assessment and Plan:  This patient is a 38 year old male with a history of depression anxiety and ADD.  He is doing very well on his current regimen.  He will continue Prozac 20 mg twice daily for depression, Lamictal 100 mg daily for mood stabilization, Xanax 0.5 mg twice daily for anxiety and Adderall 30 mg twice daily for focus.  He will return to see me in 3 months  Diannia Rudereborah Jenaveve Fenstermaker, MD 10/08/2018, 2:08 PM

## 2018-12-07 ENCOUNTER — Other Ambulatory Visit (HOSPITAL_COMMUNITY): Payer: Self-pay | Admitting: Psychiatry

## 2018-12-16 ENCOUNTER — Other Ambulatory Visit (HOSPITAL_COMMUNITY): Payer: Self-pay | Admitting: Psychiatry

## 2018-12-25 DIAGNOSIS — R5383 Other fatigue: Secondary | ICD-10-CM | POA: Diagnosis not present

## 2019-01-07 ENCOUNTER — Ambulatory Visit (INDEPENDENT_AMBULATORY_CARE_PROVIDER_SITE_OTHER): Payer: 59 | Admitting: Psychiatry

## 2019-01-07 ENCOUNTER — Other Ambulatory Visit: Payer: Self-pay

## 2019-01-07 ENCOUNTER — Encounter (HOSPITAL_COMMUNITY): Payer: Self-pay | Admitting: Psychiatry

## 2019-01-07 DIAGNOSIS — F321 Major depressive disorder, single episode, moderate: Secondary | ICD-10-CM

## 2019-01-07 DIAGNOSIS — F411 Generalized anxiety disorder: Secondary | ICD-10-CM | POA: Diagnosis not present

## 2019-01-07 DIAGNOSIS — F900 Attention-deficit hyperactivity disorder, predominantly inattentive type: Secondary | ICD-10-CM

## 2019-01-07 MED ORDER — AMPHETAMINE-DEXTROAMPHETAMINE 30 MG PO TABS: 30.0000 mg | ORAL_TABLET | Freq: Two times a day (BID) | ORAL | 0 refills | Status: DC

## 2019-01-07 MED ORDER — ALPRAZOLAM 0.5 MG PO TABS: 0.5000 mg | ORAL_TABLET | Freq: Two times a day (BID) | ORAL | 2 refills | Status: DC

## 2019-01-07 MED ORDER — LAMOTRIGINE 100 MG PO TABS: 100.0000 mg | ORAL_TABLET | Freq: Every day | ORAL | 2 refills | Status: DC

## 2019-01-07 MED ORDER — FLUOXETINE HCL 20 MG PO CAPS: 20.0000 mg | ORAL_CAPSULE | Freq: Two times a day (BID) | ORAL | 2 refills | Status: DC

## 2019-01-07 NOTE — Progress Notes (Signed)
BH MD/PA/NP OP Progress Note  01/07/2019 11:36 AM Jason [name]  MRN:  [id number]  Chief Complaint:  Chief Complaint    Depression; Anxiety; ADD     Virtual Visit via Video Note  I connected with Jason [name] on 01/07/19 at 11:20 AM EDT by a video enabled telemedicine application and verified that I am speaking with the correct person using two identifiers.   I discussed the limitations of evaluation and management by telemedicine and the availability of in person appointments. The patient expressed understanding and agreed to proceed.     I discussed the assessment and treatment plan with the patient. The patient was provided an opportunity to ask questions and all were answered. The patient agreed with the plan and demonstrated an understanding of the instructions.   The patient was advised to call back or seek an in-person evaluation if the symptoms worsen or if the condition fails to improve as anticipated.  I provided 15 minutes of non-face-to-face time during this encounter.   [name], MD  This patient is a 38 year old married white male who lives with his wife in [place]. They have no children. He iscurrently working in a warehouse  The patient was referred by his primary physician, Dr. Selinda Flavin for further assessment of depression and anxiety.  The patient states that he is always been a little bit on the depressive side and anxious person who is shy. However over the last 7 or 8 months he is become excessively anxious and worried. He wakes up with a feeling of dread and it doesn't go away. He's had little drive motivation or interest in doing anything that he usually enjoys. He's had several stressors. His wife has been very sick and has no energy and may have MS or another autoimmune disorder. Her doctors have not been able to figure out what is wrong. She does very little other than sleep all day.  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 He becomes anxious shy and panicky. His company was bought by a new firm and they are expecting a lot more out of him which is made him very anxious. He states that his eyes have difficulty with social anxiety. But lately he's had trouble even talking to his friends and just "can't come up with anything to say." He feels very awkward and self-conscious much of the time.  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 He also enjoys painting. He has various projects that he just can't complete. His mind races and he can't decide what to do first. He often feels depressed and sad and feels like crying but can't. His energy is low. He denies suicidal ideation. He eats well and exercises 4-5 times a week. He doesn't use drugs and drinks very little. He sleeps very well but always feels tired. He has been tried on several antidepressants. Zoloft helped but made him very drowsy. Prozac and Celexa did not help and Wellbutrin made him feel wired. He's currently on a low dose of clonazepam but it makes him drowsy as well. He's never had past psychiatric treatment or counseling  The patient returns after 3 months.  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  He is still making 40  hours a week however.  Overall his mood is good he denies symptoms of depression or anxiety.  He is sleeping well.  During his free time he is spending time doing home projects and playing music.  He does not have any specific symptoms and states he is feeling well focused on the Adderall.  He has no new health issues. Visit Diagnosis:    ICD-10-CM   1. Major depressive disorder, single episode, moderate with anxious distress (HCC) F32.1   2. Generalized anxiety disorder F41.1   3. Attention deficit  hyperactivity disorder (ADHD), predominantly inattentive type F90.0     Past Psychiatric History: none  Past Medical History:  Past Medical History:  Diagnosis Date  . Anxiety disorder   . Elevated cholesterol   . Major depressive disorder    History reviewed. No pertinent surgical history.  Family Psychiatric History: See below  Family History:  Family History  Problem Relation Age of Onset  . Hypertension Mother   . Hyperlipidemia Mother   . Hypertension Father   . Heart attack Paternal Grandfather     Social History:  Social History   Socioeconomic History  . Marital status: Married    Spouse name: Not on file  . Number of children: Not on file  . Years of education: Not on file  . Highest education level: Not on file  Occupational History  . Not on file  Social Needs  . Financial resource strain: Not on file  . Food insecurity:    Worry: Not on file    Inability: Not on file  . Transportation needs:    Medical: Not on file    Non-medical: Not on file  Tobacco Use  . Smoking status: Former Games developermoker  . Smokeless tobacco: Never Used  . Tobacco comment: Quit 06-2008  Substance and Sexual Activity  . Alcohol use: No    Alcohol/week: 0.0 standard drinks    Comment: 12-07-15 per pt no  . Drug use: No    Comment: 12-07-2015 per pt no  . Sexual activity: Yes  Lifestyle  . Physical activity:    Days per week: Not on file    Minutes per session: Not on file  . Stress: Not on file  Relationships  . Social connections:    Talks on phone: Not on file    Gets together: Not on file    Attends religious service: Not on file    Active member of club or organization: Not on file    Attends meetings of clubs or organizations: Not on file    Relationship status: Not on file  Other Topics Concern  . Not on file  Social History Narrative  . Not on file    Allergies: No Known Allergies  Metabolic Disorder Labs: No results found for: HGBA1C, MPG No results found  for: PROLACTIN No results found for: CHOL, TRIG, HDL, CHOLHDL, VLDL, LDLCALC No results found for: TSH  Therapeutic Level Labs: No results found for: LITHIUM No results found for: VALPROATE No components found for:  CBMZ  Current Medications: Current Outpatient Medications  Medication Sig Dispense Refill  . acyclovir (ZOVIRAX) 200 MG capsule Take 200 mg by mouth 2 (two) times daily. Reported on 01/05/2016  3  . ALPRAZolam (XANAX) 0.5 MG tablet Take 1 tablet (0.5 mg total) by mouth 2 (two) times daily. 30 tablet 2  . amphetamine-dextroamphetamine (ADDERALL) 30 MG tablet Take 1 tablet by mouth 2 (two) times daily. 60 tablet 0  . amphetamine-dextroamphetamine (ADDERALL)  30 MG tablet Take 1 tablet by mouth 2 (two) times daily. 60 tablet 0  . amphetamine-dextroamphetamine (ADDERALL) 30 MG tablet Take 1 tablet by mouth 2 (two) times daily. 60 tablet 0  . FLUoxetine (PROZAC) 20 MG capsule Take 1 capsule (20 mg total) by mouth 2 (two) times daily. 60 capsule 2  . lamoTRIgine (LAMICTAL) 100 MG tablet Take 1 tablet (100 mg total) by mouth daily. 30 tablet 2  . losartan (COZAAR) 25 MG tablet Take 1 tablet (25 mg total) by mouth daily. 30 tablet 6   No current facility-administered medications for this visit.      Musculoskeletal: Strength & Muscle Tone: within normal limits Gait & Station: normal Patient leans: N/A  Psychiatric Specialty Exam: Review of Systems  All other systems reviewed and are negative.   There were no vitals taken for this visit.There is no height or weight on file to calculate BMI.  General Appearance: Casual and Fairly Groomed  Eye Contact:  Good  Speech:  Clear and Coherent  Volume:  Normal  Mood:  Euthymic  Affect:  Appropriate and Congruent  Thought Process:  Goal Directed  Orientation:  Full (Time, Place, and Person)  Thought Content: WDL   Suicidal Thoughts:  No  Homicidal Thoughts:  No  Memory:  Immediate;   Good Recent;   Good Remote;   Good   Judgement:  Good  Insight:  Fair  Psychomotor Activity:  Normal  Concentration:  Concentration: Good and Attention Span: Good  Recall:  Good  Fund of Knowledge: Good  Language: Good  Akathisia:  No  Handed:  Right  AIMS (if indicated): not done  Assets:  Communication Skills Desire for Improvement Physical Health Resilience Social Support Talents/Skills  ADL's:  Intact  Cognition: WNL  Sleep:  Good   Screenings:   Assessment and Plan: This patient is a 38 year old male with a history of depression anxiety and ADD.  He is doing well on his current regimen.  He will continue Prozac 20 mg twice daily for depression, Lamictal 100 mg daily for mood stabilization, Xanax 0.5 mg twice daily for anxiety and Adderall 30 mg twice daily for focus.  He will return to see me in 3 months   [name], MD 01/07/2019, 11:36 AM

## 2019-01-22 ENCOUNTER — Other Ambulatory Visit (HOSPITAL_COMMUNITY): Payer: Self-pay | Admitting: Psychiatry

## 2019-02-28 ENCOUNTER — Emergency Department (HOSPITAL_COMMUNITY): Payer: 59

## 2019-02-28 ENCOUNTER — Emergency Department (HOSPITAL_COMMUNITY)
Admission: EM | Admit: 2019-02-28 | Discharge: 2019-02-28 | Disposition: A | Discharge status: Home / Self Care | Payer: 59 | Attending: Emergency Medicine | Admitting: Emergency Medicine

## 2019-02-28 ENCOUNTER — Encounter (HOSPITAL_COMMUNITY): Payer: Self-pay | Admitting: Emergency Medicine

## 2019-02-28 ENCOUNTER — Other Ambulatory Visit: Payer: Self-pay

## 2019-02-28 DIAGNOSIS — S42022A Displaced fracture of shaft of left clavicle, initial encounter for closed fracture: Secondary | ICD-10-CM | POA: Diagnosis not present

## 2019-02-28 DIAGNOSIS — S4992XA Unspecified injury of left shoulder and upper arm, initial encounter: Secondary | ICD-10-CM | POA: Diagnosis present

## 2019-02-28 DIAGNOSIS — Z79899 Other long term (current) drug therapy: Secondary | ICD-10-CM | POA: Insufficient documentation

## 2019-02-28 DIAGNOSIS — F419 Anxiety disorder, unspecified: Secondary | ICD-10-CM | POA: Insufficient documentation

## 2019-02-28 DIAGNOSIS — Y9289 Other specified places as the place of occurrence of the external cause: Secondary | ICD-10-CM | POA: Diagnosis not present

## 2019-02-28 DIAGNOSIS — Z87891 Personal history of nicotine dependence: Secondary | ICD-10-CM | POA: Diagnosis not present

## 2019-02-28 DIAGNOSIS — Y998 Other external cause status: Secondary | ICD-10-CM | POA: Insufficient documentation

## 2019-02-28 DIAGNOSIS — Y93I9 Activity, other involving external motion: Secondary | ICD-10-CM | POA: Diagnosis not present

## 2019-02-28 MED ORDER — MORPHINE SULFATE (PF) 4 MG/ML IV SOLN: 4.0000 mg | Freq: Once | INTRAVENOUS | Status: DC

## 2019-02-28 MED ORDER — HYDROCODONE-ACETAMINOPHEN 5-325 MG PO TABS: 1.0000 | ORAL_TABLET | ORAL | 0 refills | Status: DC | PRN

## 2019-02-28 MED ORDER — MORPHINE SULFATE (PF) 4 MG/ML IV SOLN
4.0000 mg | Freq: Once | INTRAVENOUS | Status: AC
  Administered 2019-02-28: 21:00:00 4 mg via INTRAMUSCULAR
  Filled 2019-02-28: qty 1

## 2019-02-28 NOTE — ED Notes (Signed)
Patient already has arm sling applied to left arm.
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
## 2019-02-28 NOTE — Discharge Instructions (Signed)
Follow-up with Dr. Sharol Given, orthopedic surgery daily.  Keep the sling on at all times.  Take the pain medicine as prescribed.  Do not drive or operate heavy machinery while taking this medicine.  This medicine may become addictive.  Return to the ED for any new worsening symptoms.

## 2019-03-02 ENCOUNTER — Ambulatory Visit (INDEPENDENT_AMBULATORY_CARE_PROVIDER_SITE_OTHER): Payer: 59 | Admitting: Orthopedic Surgery

## 2019-03-02 ENCOUNTER — Other Ambulatory Visit: Payer: Self-pay

## 2019-03-02 ENCOUNTER — Encounter: Payer: Self-pay | Admitting: Orthopedic Surgery

## 2019-03-02 VITALS — Ht 65.0 in | Wt 149.0 lb

## 2019-03-02 DIAGNOSIS — S42025A Nondisplaced fracture of shaft of left clavicle, initial encounter for closed fracture: Secondary | ICD-10-CM | POA: Diagnosis not present

## 2019-03-02 MED ORDER — HYDROCODONE-ACETAMINOPHEN 5-325 MG PO TABS: 1.0000 | ORAL_TABLET | ORAL | 0 refills | Status: DC | PRN
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
## 2019-03-09 ENCOUNTER — Other Ambulatory Visit (HOSPITAL_COMMUNITY): Payer: Self-pay | Admitting: Psychiatry

## 2019-03-10 ENCOUNTER — Other Ambulatory Visit (HOSPITAL_COMMUNITY): Payer: Self-pay | Admitting: Psychiatry

## 2019-03-10 ENCOUNTER — Telehealth (HOSPITAL_COMMUNITY): Payer: Self-pay

## 2019-03-10 NOTE — Telephone Encounter (Signed)
Patient called requesting a refill on his Alprazolam 0.5mg . He has a scheduled appointment for 04/09/19. Please review and advise. Thank you.

## 2019-03-10 NOTE — Telephone Encounter (Signed)
sent 

## 2019-03-16 ENCOUNTER — Ambulatory Visit: Payer: 59 | Admitting: Orthopedic Surgery

## 2019-04-09 ENCOUNTER — Other Ambulatory Visit: Payer: Self-pay

## 2019-04-09 ENCOUNTER — Ambulatory Visit (INDEPENDENT_AMBULATORY_CARE_PROVIDER_SITE_OTHER): Payer: 59 | Admitting: Psychiatry

## 2019-04-09 ENCOUNTER — Encounter (HOSPITAL_COMMUNITY): Payer: Self-pay | Admitting: Psychiatry

## 2019-04-09 DIAGNOSIS — F900 Attention-deficit hyperactivity disorder, predominantly inattentive type: Secondary | ICD-10-CM

## 2019-04-09 DIAGNOSIS — F321 Major depressive disorder, single episode, moderate: Secondary | ICD-10-CM | POA: Diagnosis not present

## 2019-04-09 DIAGNOSIS — F411 Generalized anxiety disorder: Secondary | ICD-10-CM

## 2019-04-09 MED ORDER — AMPHETAMINE-DEXTROAMPHETAMINE 30 MG PO TABS: 30.0000 mg | ORAL_TABLET | Freq: Two times a day (BID) | ORAL | 0 refills | Status: DC

## 2019-04-09 MED ORDER — FLUOXETINE HCL 20 MG PO CAPS: 20.0000 mg | ORAL_CAPSULE | Freq: Two times a day (BID) | ORAL | 2 refills | Status: DC

## 2019-04-09 MED ORDER — ALPRAZOLAM 0.5 MG PO TABS: 0.5000 mg | ORAL_TABLET | Freq: Two times a day (BID) | ORAL | 2 refills | Status: DC
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
## 2019-07-06 ENCOUNTER — Other Ambulatory Visit (HOSPITAL_COMMUNITY): Payer: Self-pay | Admitting: Psychiatry

## 2019-07-07 NOTE — Telephone Encounter (Signed)
PER PROVIDER: Call pt for appt . APPT SCHEDULED FOR 07/22/2019

## 2019-07-07 NOTE — Telephone Encounter (Signed)
Call pt for appt 

## 2019-07-13 ENCOUNTER — Telehealth (HOSPITAL_COMMUNITY): Payer: Self-pay | Admitting: *Deleted

## 2019-07-13 ENCOUNTER — Other Ambulatory Visit (HOSPITAL_COMMUNITY): Payer: Self-pay | Admitting: Psychiatry

## 2019-07-13 MED ORDER — LAMOTRIGINE 100 MG PO TABS: 100.0000 mg | ORAL_TABLET | Freq: Every day | ORAL | 2 refills | Status: DC

## 2019-07-13 MED ORDER — FLUOXETINE HCL 20 MG PO CAPS: 20.0000 mg | ORAL_CAPSULE | Freq: Two times a day (BID) | ORAL | 2 refills | Status: DC

## 2019-07-13 MED ORDER — AMPHETAMINE-DEXTROAMPHETAMINE 30 MG PO TABS: 30.0000 mg | ORAL_TABLET | Freq: Two times a day (BID) | ORAL | 0 refills | Status: DC

## 2019-07-13 NOTE — Telephone Encounter (Signed)
Sent, Let him know that Lamictal IS NOT SAFE to go on or off abruptly, can cause a rash that can be potentially dangerous or withdrawal seizure

## 2019-07-13 NOTE — Telephone Encounter (Signed)
Spoke with patient & informed per provider: Sent, Let him know that Lamictal IS NOT SAFE to go on or off abruptly, can cause a rash that can be potentially dangerous or withdrawal seizure

## 2019-07-13 NOTE — Telephone Encounter (Signed)
AS OF 04/08/2019 APPT NOTES STATES THAT :He seems to be doing okay without the Lamictal. I ASKED PATIENT DID HE STOP TAKING ? HE SAYS OH YEAH I STARTED BACK & WAS GOING TO TELL THE DR @ OUR APPT.[name] PATIENT NOW REQUESTING REFILL FOR Lamictal ALSO

## 2019-07-13 NOTE — Telephone Encounter (Signed)
PATIENT CALLED [facility] OUT OF MEDICATION & REQUESTING 30 DAY REFILL FOR MED'S UNTIL APPT ON  07/22/2019

## 2019-07-13 NOTE — Telephone Encounter (Signed)
sent 

## 2019-07-22 ENCOUNTER — Ambulatory Visit (INDEPENDENT_AMBULATORY_CARE_PROVIDER_SITE_OTHER): Payer: 59 | Admitting: Psychiatry

## 2019-07-22 ENCOUNTER — Encounter (HOSPITAL_COMMUNITY): Payer: Self-pay | Admitting: Psychiatry

## 2019-07-22 ENCOUNTER — Other Ambulatory Visit: Payer: Self-pay

## 2019-07-22 DIAGNOSIS — F321 Major depressive disorder, single episode, moderate: Secondary | ICD-10-CM | POA: Diagnosis not present

## 2019-07-22 DIAGNOSIS — F900 Attention-deficit hyperactivity disorder, predominantly inattentive type: Secondary | ICD-10-CM

## 2019-07-22 DIAGNOSIS — F411 Generalized anxiety disorder: Secondary | ICD-10-CM

## 2019-07-22 MED ORDER — AMPHETAMINE-DEXTROAMPHETAMINE 30 MG PO TABS: 30.0000 mg | ORAL_TABLET | Freq: Two times a day (BID) | ORAL | 0 refills | Status: DC

## 2019-07-22 MED ORDER — ALPRAZOLAM 0.5 MG PO TABS: 0.5000 mg | ORAL_TABLET | Freq: Two times a day (BID) | ORAL | 0 refills | Status: DC

## 2019-07-22 MED ORDER — FLUOXETINE HCL 20 MG PO CAPS: 20.0000 mg | ORAL_CAPSULE | Freq: Two times a day (BID) | ORAL | 2 refills | Status: DC

## 2019-07-22 MED ORDER — LAMOTRIGINE 100 MG PO TABS: 100.0000 mg | ORAL_TABLET | Freq: Two times a day (BID) | ORAL | 2 refills | Status: DC
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
## 2019-09-02 ENCOUNTER — Encounter (HOSPITAL_COMMUNITY): Payer: Self-pay | Admitting: Psychiatry

## 2019-09-02 ENCOUNTER — Ambulatory Visit (INDEPENDENT_AMBULATORY_CARE_PROVIDER_SITE_OTHER): Payer: 59 | Admitting: Psychiatry

## 2019-09-02 ENCOUNTER — Other Ambulatory Visit: Payer: Self-pay

## 2019-09-02 DIAGNOSIS — F411 Generalized anxiety disorder: Secondary | ICD-10-CM | POA: Diagnosis not present

## 2019-09-02 DIAGNOSIS — F321 Major depressive disorder, single episode, moderate: Secondary | ICD-10-CM

## 2019-09-02 DIAGNOSIS — F900 Attention-deficit hyperactivity disorder, predominantly inattentive type: Secondary | ICD-10-CM

## 2019-09-02 MED ORDER — ALPRAZOLAM 0.5 MG PO TABS: 0.5000 mg | ORAL_TABLET | Freq: Two times a day (BID) | ORAL | 0 refills | Status: DC

## 2019-09-02 MED ORDER — AMPHETAMINE-DEXTROAMPHETAMINE 30 MG PO TABS: 30.0000 mg | ORAL_TABLET | Freq: Two times a day (BID) | ORAL | 0 refills | Status: DC

## 2019-09-02 MED ORDER — FLUOXETINE HCL 20 MG PO CAPS: 20.0000 mg | ORAL_CAPSULE | Freq: Two times a day (BID) | ORAL | 2 refills | Status: DC

## 2019-09-02 MED ORDER — LAMOTRIGINE 100 MG PO TABS: 100.0000 mg | ORAL_TABLET | Freq: Two times a day (BID) | ORAL | 2 refills | Status: DC

## 2019-09-02 NOTE — Progress Notes (Signed)
Virtual Visit via Video Note  I connected with [name] on 09/02/19 at  2:40 PM EST by a video enabled telemedicine application and verified that I am speaking with the correct person using two identifiers.   I discussed the limitations of evaluation and management by telemedicine and the availability of in person appointments. The patient expressed understanding and agreed to proceed    I discussed the assessment and treatment plan with the patient. The patient was provided an opportunity to ask questions and all were answered. The patient agreed with the plan and demonstrated an understanding of the instructions.   The patient was advised to call back or seek an in-person evaluation if the symptoms worsen or if the condition fails to improve as anticipated.  I provided 15 minutes of non-face-to-face time during this encounter.   [name], MD  [facility] MD/PA/NP OP Progress Note  09/02/2019 2:43 PM [name]  MRN:  [id number]  Chief Complaint:  Chief Complaint    Depression; Anxiety; ADHD; Follow-up     QBH:ALPF patient is a 39 year old married white male who lives with his wife in [place].  They have no children.  He is currently working in a warehouse.  The patient returns after 6 weeks for follow-up regarding depression anxiety and a attention deficit disorder.  At last visit the patient stated he was more depressed and having mood swings.  He just recently got back on Lamictal and we increased his dosage.  He now states that he is doing much better.  He is having very infrequent mood swings and states he is not depressed.  He is sleeping well, his energy is good.  He still has some level of social anxiety but not as bad as in the past.  He is able to function well at his job.  He denies any thoughts of self-harm or suicidal ideation. Visit Diagnosis:    ICD-10-CM   1. Major depressive disorder, single episode, moderate with anxious distress (HCC)  F32.1   2.  Generalized anxiety disorder  F41.1   3. Attention deficit hyperactivity disorder (ADHD), predominantly inattentive type  F90.0     Past Psychiatric History: none  Past Medical History:  Past Medical History:  Diagnosis Date  . Anxiety disorder   . Elevated cholesterol   . Major depressive disorder    History reviewed. No pertinent surgical history.  Family Psychiatric History: see below  Family History:  Family History  Problem Relation Age of Onset  . Hypertension Mother   . Hyperlipidemia Mother   . Hypertension Father   . Heart attack Paternal Grandfather     Social History:  Social History   Socioeconomic History  . Marital status: Married    Spouse name: Not on file  . Number of children: Not on file  . Years of education: Not on file  . Highest education level: Not on file  Occupational History  . Not on file  Tobacco Use  . Smoking status: Former Games developer  . Smokeless tobacco: Never Used  . Tobacco comment: Quit 06-2008  Substance and Sexual Activity  . Alcohol use: No    Alcohol/week: 0.0 standard drinks    Comment: 12-07-15 per pt no  . Drug use: No    Comment: 12-07-2015 per pt no  . Sexual activity: Yes  Other Topics Concern  . Not on file  Social History Narrative  . Not on file   Social Determinants of Health   Financial  Resource Strain:   . Difficulty of Paying Living Expenses: Not on file  Food Insecurity:   . Worried About Programme researcher, broadcasting/film/video in the Last Year: Not on file  . Ran Out of Food in the Last Year: Not on file  Transportation Needs:   . Lack of Transportation (Medical): Not on file  . Lack of Transportation (Non-Medical): Not on file  Physical Activity:   . Days of Exercise per Week: Not on file  . Minutes of Exercise per Session: Not on file  Stress:   . Feeling of Stress : Not on file  Social Connections:   . Frequency of Communication with Friends and Family: Not on file  . Frequency of Social Gatherings with Friends and  Family: Not on file  . Attends Religious Services: Not on file  . Active Member of Clubs or Organizations: Not on file  . Attends Banker Meetings: Not on file  . Marital Status: Not on file    Allergies: No Known Allergies  Metabolic Disorder Labs: No results found for: HGBA1C, MPG No results found for: PROLACTIN No results found for: CHOL, TRIG, HDL, CHOLHDL, VLDL, LDLCALC No results found for: TSH  Therapeutic Level Labs: No results found for: LITHIUM No results found for: VALPROATE No components found for:  CBMZ  Current Medications: Current Outpatient Medications  Medication Sig Dispense Refill  . acyclovir (ZOVIRAX) 200 MG capsule Take 200 mg by mouth 2 (two) times daily. Reported on 01/05/2016  3  . ALPRAZolam (XANAX) 0.5 MG tablet Take 1 tablet (0.5 mg total) by mouth 2 (two) times daily. 60 tablet 0  . amphetamine-dextroamphetamine (ADDERALL) 30 MG tablet Take 1 tablet by mouth 2 (two) times daily. 60 tablet 0  . amphetamine-dextroamphetamine (ADDERALL) 30 MG tablet Take 1 tablet by mouth 2 (two) times daily. 60 tablet 0  . amphetamine-dextroamphetamine (ADDERALL) 30 MG tablet Take 1 tablet by mouth 2 (two) times daily. 60 tablet 0  . FLUoxetine (PROZAC) 20 MG capsule Take 1 capsule (20 mg total) by mouth 2 (two) times daily. 60 capsule 2  . lamoTRIgine (LAMICTAL) 100 MG tablet Take 1 tablet (100 mg total) by mouth 2 (two) times daily. 60 tablet 2  . losartan (COZAAR) 25 MG tablet Take 1 tablet (25 mg total) by mouth daily. 30 tablet 6   No current facility-administered medications for this visit.     Musculoskeletal: Strength & Muscle Tone: within normal limits Gait & Station: normal Patient leans: N/A  Psychiatric Specialty Exam: Review of Systems  All other systems reviewed and are negative.   There were no vitals taken for this visit.There is no height or weight on file to calculate BMI.  General Appearance: Casual and Fairly Groomed  Eye  Contact:  Good  Speech:  Clear and Coherent  Volume:  Normal  Mood:  Euthymic  Affect:  Appropriate and Congruent  Thought Process:  Goal Directed  Orientation:  Full (Time, Place, and Person)  Thought Content: WDL   Suicidal Thoughts:  No  Homicidal Thoughts:  No  Memory:  Immediate;   Good Recent;   Good Remote;   Good  Judgement:  Good  Insight:  Fair  Psychomotor Activity:  Normal  Concentration:  Concentration: Good and Attention Span: Good  Recall:  Good  Fund of Knowledge: Good  Language: Good  Akathisia:  No  Handed:  Right  AIMS (if indicated): not done  Assets:  Communication Skills Desire for Improvement Physical Health Resilience Social  Support Talents/Skills  ADL's:  Intact  Cognition: WNL  Sleep:  Good   Screenings:   Assessment and Plan: This patient is a 39 year old male with a history of depression anxiety and ADD.  He was having significant mood swings but these are much improved on Lamictal 200 mg daily.  He has had no side effects or rashes.  He will continue this medication as well as Prozac 20 mg twice daily for depression, Xanax 0.5 mg twice daily as needed for anxiety and Adderall 30 mg twice daily for ADD.  He will return to see me in 3 months   Levonne Spiller, MD 09/02/2019, 2:43 PM

## 2019-10-04 ENCOUNTER — Other Ambulatory Visit (HOSPITAL_COMMUNITY): Payer: Self-pay | Admitting: Psychiatry

## 2019-12-09 ENCOUNTER — Encounter (HOSPITAL_COMMUNITY): Payer: Self-pay | Admitting: Psychiatry

## 2019-12-09 ENCOUNTER — Ambulatory Visit (INDEPENDENT_AMBULATORY_CARE_PROVIDER_SITE_OTHER): Payer: 59 | Admitting: Psychiatry

## 2019-12-09 ENCOUNTER — Other Ambulatory Visit: Payer: Self-pay

## 2019-12-09 DIAGNOSIS — F411 Generalized anxiety disorder: Secondary | ICD-10-CM | POA: Diagnosis not present

## 2019-12-09 DIAGNOSIS — F321 Major depressive disorder, single episode, moderate: Secondary | ICD-10-CM | POA: Diagnosis not present

## 2019-12-09 DIAGNOSIS — F900 Attention-deficit hyperactivity disorder, predominantly inattentive type: Secondary | ICD-10-CM | POA: Diagnosis not present

## 2019-12-09 MED ORDER — ALPRAZOLAM 0.5 MG PO TABS: ORAL_TABLET | ORAL | 2 refills | Status: DC

## 2019-12-09 MED ORDER — FLUOXETINE HCL 20 MG PO CAPS: 20.0000 mg | ORAL_CAPSULE | Freq: Two times a day (BID) | ORAL | 2 refills | Status: DC

## 2019-12-09 MED ORDER — LAMOTRIGINE 100 MG PO TABS: 100.0000 mg | ORAL_TABLET | Freq: Two times a day (BID) | ORAL | 2 refills | Status: DC

## 2019-12-09 MED ORDER — AMPHETAMINE-DEXTROAMPHETAMINE 30 MG PO TABS: 30.0000 mg | ORAL_TABLET | Freq: Two times a day (BID) | ORAL | 0 refills | Status: DC

## 2019-12-09 NOTE — Progress Notes (Signed)
Virtual Visit via Video Note  I connected with Jason [name] on 12/09/19 at 11:00 AM EDT by a video enabled telemedicine application and verified that I am speaking with the correct person using two identifiers.   I discussed the limitations of evaluation and management by telemedicine and the availability of in person appointments. The patient expressed understanding and agreed to proceed    I discussed the assessment and treatment plan with the patient. The patient was provided an opportunity to ask questions and all were answered. The patient agreed with the plan and demonstrated an understanding of the instructions.   The patient was advised to call back or seek an in-person evaluation if the symptoms worsen or if the condition fails to improve as anticipated.  I provided 15 minutes of non-face-to-face time during this encounter.   [name], MD  [facility] MD/PA/NP OP Progress Note  12/09/2019 11:12 AM Jason [name]  MRN:  [id number]  Chief Complaint:  Chief Complaint    Anxiety; Depression; ADHD; Follow-up     HPI: This patient is a 39 year old married white male who lives with his wife in [place].  They have no children.  He is currently working in a warehouse.  The patient returns for follow-up after 3 months regarding depression anxiety and ADD.  He states that everything is going well for him right now.  His mood has been stable and he denies significant mood swings irritability depression or anxiety.  He is using the Xanax at work and it is helping a lot with his social anxiety.  He is sleeping well and his energy is good.  He denies any thoughts of self-harm or suicide. Visit Diagnosis:    ICD-10-CM   1. Major depressive disorder, single episode, moderate with anxious distress (HCC)  F32.1   2. Generalized anxiety disorder  F41.1   3. Attention deficit hyperactivity disorder (ADHD), predominantly inattentive type  F90.0     Past Psychiatric History:  none  Past Medical History:  Past Medical History:  Diagnosis Date  . Anxiety disorder   . Elevated cholesterol   . Major depressive disorder    History reviewed. No pertinent surgical history.  Family Psychiatric History: see below  Family History:  Family History  Problem Relation Age of Onset  . Hypertension Mother   . Hyperlipidemia Mother   . Hypertension Father   . Heart attack Paternal Grandfather     Social History:  Social History   Socioeconomic History  . Marital status: Married    Spouse name: Not on file  . Number of children: Not on file  . Years of education: Not on file  . Highest education level: Not on file  Occupational History  . Not on file  Tobacco Use  . Smoking status: Former Games developer  . Smokeless tobacco: Never Used  . Tobacco comment: Quit 06-2008  Substance and Sexual Activity  . Alcohol use: No    Alcohol/week: 0.0 standard drinks    Comment: 12-07-15 per pt no  . Drug use: No    Comment: 12-07-2015 per pt no  . Sexual activity: Yes  Other Topics Concern  . Not on file  Social History Narrative  . Not on file   Social Determinants of Health   Financial Resource Strain:   . Difficulty of Paying Living Expenses:   Food Insecurity:   . Worried About Programme researcher, broadcasting/film/video in the Last Year:   . [employer] of Food in the  Last Year:   Transportation Needs:   . Freight forwarder (Medical):   [name] Lack of Transportation (Non-Medical):   Physical Activity:   . Days of Exercise per Week:   . Minutes of Exercise per Session:   Stress:   . Feeling of Stress :   Social Connections:   . Frequency of Communication with Friends and Family:   . Frequency of Social Gatherings with Friends and Family:   . Attends Religious Services:   . Active Member of Clubs or Organizations:   . Attends Banker Meetings:   [name] Marital Status:     Allergies: No Known Allergies  Metabolic Disorder Labs: No results found for: HGBA1C, MPG No  results found for: PROLACTIN No results found for: CHOL, TRIG, HDL, CHOLHDL, VLDL, LDLCALC No results found for: TSH  Therapeutic Level Labs: No results found for: LITHIUM No results found for: VALPROATE No components found for:  CBMZ  Current Medications: Current Outpatient Medications  Medication Sig Dispense Refill  . acyclovir (ZOVIRAX) 200 MG capsule Take 200 mg by mouth 2 (two) times daily. Reported on 01/05/2016  3  . ALPRAZolam (XANAX) 0.5 MG tablet TAKE 1 TABLET(0.5 MG) BY MOUTH TWICE DAILY 60 tablet 2  . amphetamine-dextroamphetamine (ADDERALL) 30 MG tablet Take 1 tablet by mouth 2 (two) times daily. 60 tablet 0  . amphetamine-dextroamphetamine (ADDERALL) 30 MG tablet Take 1 tablet by mouth 2 (two) times daily. 60 tablet 0  . amphetamine-dextroamphetamine (ADDERALL) 30 MG tablet Take 1 tablet by mouth 2 (two) times daily. 60 tablet 0  . FLUoxetine (PROZAC) 20 MG capsule Take 1 capsule (20 mg total) by mouth 2 (two) times daily. 60 capsule 2  . lamoTRIgine (LAMICTAL) 100 MG tablet Take 1 tablet (100 mg total) by mouth 2 (two) times daily. 60 tablet 2  . losartan (COZAAR) 25 MG tablet Take 1 tablet (25 mg total) by mouth daily. 30 tablet 6   No current facility-administered medications for this visit.     Musculoskeletal: Strength & Muscle Tone: within normal limits Gait & Station: normal Patient leans: N/A  Psychiatric Specialty Exam: Review of Systems  All other systems reviewed and are negative.   There were no vitals taken for this visit.There is no height or weight on file to calculate BMI.  General Appearance: Casual and Fairly Groomed  Eye Contact:  Good  Speech:  Clear and Coherent  Volume:  Normal  Mood:  Euthymic  Affect:  Appropriate and Congruent  Thought Process:  Goal Directed  Orientation:  Full (Time, Place, and Person)  Thought Content: WDL   Suicidal Thoughts:  No  Homicidal Thoughts:  No  Memory:  Immediate;   Good Recent;   Good Remote;    Fair  Judgement:  Good  Insight:  Fair  Psychomotor Activity:  Normal  Concentration:  Concentration: Good and Attention Span: Good  Recall:  Good  Fund of Knowledge: Good  Language: Good  Akathisia:  No  Handed:  Right  AIMS (if indicated): not done  Assets:  Communication Skills Desire for Improvement Physical Health Resilience Social Support Talents/Skills  ADL's:  Intact  Cognition: WNL  Sleep:  Good   Screenings:   Assessment and Plan: This patient is a 39 year old male with a history of depression anxiety and ADD.  He seems to be doing well with his current regimen.  He will continue Lamictal 200 mg daily for mood swings, Prozac 20 mg twice daily for depression, Xanax 0.5 mg twice  daily as needed for anxiety and Adderall 30 mg twice daily for ADD.  He will return to see me in 3 months   Levonne Spiller, MD 12/09/2019, 11:12 AM

## 2020-02-08 ENCOUNTER — Telehealth (HOSPITAL_COMMUNITY): Payer: Self-pay | Admitting: *Deleted

## 2020-02-08 ENCOUNTER — Encounter (HOSPITAL_COMMUNITY): Payer: Self-pay | Admitting: *Deleted

## 2020-02-08 ENCOUNTER — Other Ambulatory Visit: Payer: Self-pay | Admitting: Psychiatry

## 2020-02-08 MED ORDER — LAMOTRIGINE 100 MG PO TABS: 100.0000 mg | ORAL_TABLET | Freq: Two times a day (BID) | ORAL | 2 refills | Status: DC

## 2020-02-08 MED ORDER — ALPRAZOLAM 0.5 MG PO TABS: ORAL_TABLET | ORAL | 2 refills | Status: DC

## 2020-02-08 MED ORDER — FLUOXETINE HCL 20 MG PO CAPS: 20.0000 mg | ORAL_CAPSULE | Freq: Two times a day (BID) | ORAL | 2 refills | Status: DC

## 2020-02-08 MED ORDER — AMPHETAMINE-DEXTROAMPHETAMINE 30 MG PO TABS: 30.0000 mg | ORAL_TABLET | Freq: Two times a day (BID) | ORAL | 0 refills | Status: DC

## 2020-02-08 NOTE — Telephone Encounter (Signed)
noted 

## 2020-02-08 NOTE — Addendum Note (Signed)
Addended by: [name] R on: 02/08/2020 02:20 PM   Modules accepted: Orders

## 2020-02-08 NOTE — Telephone Encounter (Signed)
Per pt and his wife, they have been having problems with [facility] and getting patient his medications on time. Per pt wife they are changing over to [facility] in [place] and would like for provider to please send his prescription for June to them if possible.

## 2020-02-08 NOTE — Telephone Encounter (Signed)
sent 

## 2020-03-07 ENCOUNTER — Telehealth (INDEPENDENT_AMBULATORY_CARE_PROVIDER_SITE_OTHER): Payer: 59 | Admitting: Psychiatry

## 2020-03-07 ENCOUNTER — Encounter (HOSPITAL_COMMUNITY): Payer: Self-pay | Admitting: Psychiatry

## 2020-03-07 ENCOUNTER — Other Ambulatory Visit: Payer: Self-pay

## 2020-03-07 DIAGNOSIS — F411 Generalized anxiety disorder: Secondary | ICD-10-CM | POA: Diagnosis not present

## 2020-03-07 DIAGNOSIS — F900 Attention-deficit hyperactivity disorder, predominantly inattentive type: Secondary | ICD-10-CM

## 2020-03-07 DIAGNOSIS — F321 Major depressive disorder, single episode, moderate: Secondary | ICD-10-CM | POA: Diagnosis not present

## 2020-03-07 MED ORDER — AMPHETAMINE-DEXTROAMPHETAMINE 30 MG PO TABS: 30.0000 mg | ORAL_TABLET | Freq: Two times a day (BID) | ORAL | 0 refills | Status: DC

## 2020-03-07 MED ORDER — ALPRAZOLAM 0.5 MG PO TABS: ORAL_TABLET | ORAL | 2 refills | Status: DC

## 2020-03-07 MED ORDER — FLUOXETINE HCL 20 MG PO CAPS: 20.0000 mg | ORAL_CAPSULE | Freq: Two times a day (BID) | ORAL | 2 refills | Status: DC

## 2020-03-07 MED ORDER — LAMOTRIGINE 100 MG PO TABS: 100.0000 mg | ORAL_TABLET | Freq: Two times a day (BID) | ORAL | 2 refills | Status: DC

## 2020-03-07 NOTE — Progress Notes (Signed)
Virtual Visit via Video Note  I connected with Jason [name] on 03/07/20 at 10:40 AM EDT by a video enabled telemedicine application and verified that I am speaking with the correct person using two identifiers.   I discussed the limitations of evaluation and management by telemedicine and the availability of in person appointments. The patient expressed understanding and agreed to proceed.    I discussed the assessment and treatment plan with the patient. The patient was provided an opportunity to ask questions and all were answered. The patient agreed with the plan and demonstrated an understanding of the instructions.   The patient was advised to call back or seek an in-person evaluation if the symptoms worsen or if the condition fails to improve as anticipated.  I provided 15 minutes of non-face-to-face time during this encounter. Location: Provider office, patient home  [name], MD  [facility] MD/PA/NP OP Progress Note  03/07/2020 10:47 AM Jason [name]  MRN:  [id number]  Chief Complaint:  Chief Complaint    Anxiety; Depression; ADD; Follow-up     HPI: This patient is a 39 year old married white male who lives with his wife in [place].  They have no children.  He is currently working in a warehouse.  The patient returns for follow-up after 3 months regarding depression anxiety mood swings and ADD.  He states that he continues to do well.  At times he gets a little sad but nothing serious.  He and his wife went to the beach for a week and he really enjoyed it.  Denies significant mood swings depression suicidal ideation.  He denies significant panic attacks or anxiety.  He is sleeping well and eating well.  Feels all of his medications have been helpful Visit Diagnosis:    ICD-10-CM   1. Major depressive disorder, single episode, moderate with anxious distress (HCC)  F32.1   2. Generalized anxiety disorder  F41.1   3. Attention deficit hyperactivity disorder (ADHD),  predominantly inattentive type  F90.0     Past Psychiatric History: none  Past Medical History:  Past Medical History:  Diagnosis Date  . Anxiety disorder   . Elevated cholesterol   . Major depressive disorder    History reviewed. No pertinent surgical history.  Family Psychiatric History: see below  Family History:  Family History  Problem Relation Age of Onset  . Hypertension Mother   . Hyperlipidemia Mother   . Hypertension Father   . Heart attack Paternal Grandfather     Social History:  Social History   Socioeconomic History  . Marital status: Married    Spouse name: Not on file  . Number of children: Not on file  . Years of education: Not on file  . Highest education level: Not on file  Occupational History  . Not on file  Tobacco Use  . Smoking status: Former Games developer  . Smokeless tobacco: Never Used  . Tobacco comment: Quit 06-2008  Substance and Sexual Activity  . Alcohol use: No    Alcohol/week: 0.0 standard drinks    Comment: 12-07-15 per pt no  . Drug use: No    Comment: 12-07-2015 per pt no  . Sexual activity: Yes  Other Topics Concern  . Not on file  Social History Narrative  . Not on file   Social Determinants of Health   Financial Resource Strain:   . Difficulty of Paying Living Expenses:   Food Insecurity:   . Worried About Programme researcher, broadcasting/film/video in the  Last Year:   . Ran Out of Food in the Last Year:   Transportation Needs:   . Freight forwarder (Medical):   [name] Lack of Transportation (Non-Medical):   Physical Activity:   . Days of Exercise per Week:   . Minutes of Exercise per Session:   Stress:   . Feeling of Stress :   Social Connections:   . Frequency of Communication with Friends and Family:   . Frequency of Social Gatherings with Friends and Family:   . Attends Religious Services:   . Active Member of Clubs or Organizations:   . Attends Banker Meetings:   [name] Marital Status:     Allergies: No Known  Allergies  Metabolic Disorder Labs: No results found for: HGBA1C, MPG No results found for: PROLACTIN No results found for: CHOL, TRIG, HDL, CHOLHDL, VLDL, LDLCALC No results found for: TSH  Therapeutic Level Labs: No results found for: LITHIUM No results found for: VALPROATE No components found for:  CBMZ  Current Medications: Current Outpatient Medications  Medication Sig Dispense Refill  . acyclovir (ZOVIRAX) 200 MG capsule Take 200 mg by mouth 2 (two) times daily. Reported on 01/05/2016  3  . ALPRAZolam (XANAX) 0.5 MG tablet TAKE 1 TABLET(0.5 MG) BY MOUTH TWICE DAILY 60 tablet 2  . amphetamine-dextroamphetamine (ADDERALL) 30 MG tablet Take 1 tablet by mouth 2 (two) times daily. 60 tablet 0  . amphetamine-dextroamphetamine (ADDERALL) 30 MG tablet Take 1 tablet by mouth 2 (two) times daily. 60 tablet 0  . amphetamine-dextroamphetamine (ADDERALL) 30 MG tablet Take 1 tablet by mouth 2 (two) times daily. 60 tablet 0  . FLUoxetine (PROZAC) 20 MG capsule Take 1 capsule (20 mg total) by mouth 2 (two) times daily. 60 capsule 2  . lamoTRIgine (LAMICTAL) 100 MG tablet Take 1 tablet (100 mg total) by mouth 2 (two) times daily. 60 tablet 2  . losartan (COZAAR) 25 MG tablet Take 1 tablet (25 mg total) by mouth daily. 30 tablet 6   No current facility-administered medications for this visit.     Musculoskeletal: Strength & Muscle Tone: within normal limits Gait & Station: normal Patient leans: N/A  Psychiatric Specialty Exam: Review of Systems  All other systems reviewed and are negative.   There were no vitals taken for this visit.There is no height or weight on file to calculate BMI.  General Appearance: Casual and Fairly Groomed  Eye Contact:  Good  Speech:  Clear and Coherent  Volume:  Normal  Mood:  Euthymic  Affect:  Appropriate and Congruent  Thought Process:  Goal Directed  Orientation:  Full (Time, Place, and Person)  Thought Content: WDL   Suicidal Thoughts:  No   Homicidal Thoughts:  No  Memory:  Immediate;   Good Recent;   Good Remote;   Good  Judgement:  Good  Insight:  Fair  Psychomotor Activity:  Normal  Concentration:  Concentration: Good and Attention Span: Good  Recall:  Good  Fund of Knowledge: Good  Language: Good  Akathisia:  No  Handed:  Right  AIMS (if indicated): not done  Assets:  Communication Skills Desire for Improvement Physical Health Resilience Social Support Talents/Skills Vocational/Educational  ADL's:  Intact  Cognition: WNL  Sleep:  Good   Screenings:   Assessment and Plan: This patient is a 39 year old male with a history of depression anxiety and ADD.  He continues to do well with his current regimen.  He will continue Lamictal 200 mg daily for mood swings,  Prozac 20 mg twice daily for depression, Xanax 0.5 mg twice daily as needed for anxiety and Adderall 30 mg twice daily for ADD he will return to see me in 3 months   [name], MD 03/07/2020, 10:47 AM

## 2020-05-05 ENCOUNTER — Other Ambulatory Visit (HOSPITAL_COMMUNITY): Payer: Self-pay | Admitting: Psychiatry

## 2020-06-06 ENCOUNTER — Telehealth (HOSPITAL_COMMUNITY): Payer: Self-pay | Admitting: *Deleted

## 2020-06-06 ENCOUNTER — Other Ambulatory Visit (HOSPITAL_COMMUNITY): Payer: Self-pay | Admitting: Psychiatry

## 2020-06-06 MED ORDER — AMPHETAMINE-DEXTROAMPHETAMINE 30 MG PO TABS: 30.0000 mg | ORAL_TABLET | Freq: Two times a day (BID) | ORAL | 0 refills | Status: DC

## 2020-06-06 NOTE — Telephone Encounter (Signed)
Ordered.   I have utilized the [place] Controlled Substances Reporting System (PMP AWARxE) to confirm adherence regarding the patient's medication. My review reveals appropriate prescription fills.

## 2020-06-06 NOTE — Telephone Encounter (Signed)
Patient wife called stating patient is out of his adderall.

## 2020-06-06 NOTE — Telephone Encounter (Signed)
Informed patient and he is aware

## 2020-06-13 ENCOUNTER — Other Ambulatory Visit (HOSPITAL_COMMUNITY): Payer: Self-pay | Admitting: Psychiatry

## 2020-06-13 MED ORDER — ALPRAZOLAM 0.5 MG PO TABS
ORAL_TABLET | ORAL | 2 refills | Status: DC
Start: 2020-06-13 — End: 2020-06-20

## 2020-06-13 NOTE — Telephone Encounter (Signed)
I sent in xanax as well call pt for appt

## 2020-06-15 ENCOUNTER — Telehealth (HOSPITAL_COMMUNITY): Payer: 59 | Admitting: Psychiatry

## 2020-06-20 ENCOUNTER — Encounter (HOSPITAL_COMMUNITY): Payer: Self-pay | Admitting: Psychiatry

## 2020-06-20 ENCOUNTER — Other Ambulatory Visit: Payer: Self-pay

## 2020-06-20 ENCOUNTER — Telehealth (INDEPENDENT_AMBULATORY_CARE_PROVIDER_SITE_OTHER): Payer: 59 | Admitting: Psychiatry

## 2020-06-20 DIAGNOSIS — F411 Generalized anxiety disorder: Secondary | ICD-10-CM | POA: Diagnosis not present

## 2020-06-20 DIAGNOSIS — F321 Major depressive disorder, single episode, moderate: Secondary | ICD-10-CM | POA: Diagnosis not present

## 2020-06-20 DIAGNOSIS — F900 Attention-deficit hyperactivity disorder, predominantly inattentive type: Secondary | ICD-10-CM

## 2020-06-20 MED ORDER — AMPHETAMINE-DEXTROAMPHETAMINE 30 MG PO TABS: 30.0000 mg | ORAL_TABLET | Freq: Two times a day (BID) | ORAL | 0 refills | Status: DC

## 2020-06-20 MED ORDER — ALPRAZOLAM 0.5 MG PO TABS
ORAL_TABLET | ORAL | 2 refills | Status: DC
Start: 2020-06-20 — End: 2020-09-19

## 2020-06-20 MED ORDER — FLUOXETINE HCL 20 MG PO CAPS
20.0000 mg | ORAL_CAPSULE | Freq: Two times a day (BID) | ORAL | 2 refills | Status: DC
Start: 2020-06-20 — End: 2020-08-23

## 2020-06-20 MED ORDER — LAMOTRIGINE 100 MG PO TABS
100.0000 mg | ORAL_TABLET | Freq: Two times a day (BID) | ORAL | 2 refills | Status: DC
Start: 2020-06-20 — End: 2020-09-01

## 2020-06-20 NOTE — Progress Notes (Signed)
Virtual Visit via Video Note  I connected with Jason [name] on 06/20/20 at 10:20 AM EDT by a video enabled telemedicine application and verified that I am speaking with the correct person using two identifiers.  Location: Patient: home Provider: home   I discussed the limitations of evaluation and management by telemedicine and the availability of in person appointments. The patient expressed understanding and agreed to proceed.    I discussed the assessment and treatment plan with the patient. The patient was provided an opportunity to ask questions and all were answered. The patient agreed with the plan and demonstrated an understanding of the instructions.   The patient was advised to call back or seek an in-person evaluation if the symptoms worsen or if the condition fails to improve as anticipated.  I provided 15 minutes of non-face-to-face time during this encounter.   [name], MD  [facility] MD/PA/NP OP Progress Note  06/20/2020 10:38 AM Jason [name]  MRN:  [id number]  Chief Complaint:  Chief Complaint    Anxiety; Depression; ADHD; Follow-up     HPI: This patient is a 39 year old married white male who lives with his wife in [place].  They have no children.  He is working in a [employer] architect.  The patient returns for follow-up after 3 months regarding depression anxiety mood swings and ADD.  He states that he continues to do well.  He is still enjoying his job for the most part.  He is playing music with his friends and really enjoying this as well.  He is sleeping well at night.  He denies significant mood swings and states that his social anxiety has gotten a lot better since he has been taking the Xanax.  He feels all of his medications have been helpful and he is focusing well Visit Diagnosis:    ICD-10-CM   1. Major depressive disorder, single episode, moderate with anxious distress (HCC)  F32.1   2. Generalized anxiety disorder  F41.1   3. Attention deficit  hyperactivity disorder (ADHD), predominantly inattentive type  F90.0     Past Psychiatric History: none  Past Medical History:  Past Medical History:  Diagnosis Date  . Anxiety disorder   . Elevated cholesterol   . Major depressive disorder    History reviewed. No pertinent surgical history.  Family Psychiatric History: see below  Family History:  Family History  Problem Relation Age of Onset  . Hypertension Mother   . Hyperlipidemia Mother   . Hypertension Father   . Heart attack Paternal Grandfather     Social History:  Social History   Socioeconomic History  . Marital status: Married    Spouse name: Not on file  . Number of children: Not on file  . Years of education: Not on file  . Highest education level: Not on file  Occupational History  . Not on file  Tobacco Use  . Smoking status: Former Games developer  . Smokeless tobacco: Never Used  . Tobacco comment: Quit 06-2008  Substance and Sexual Activity  . Alcohol use: No    Alcohol/week: 0.0 standard drinks    Comment: 12-07-15 per pt no  . Drug use: No    Comment: 12-07-2015 per pt no  . Sexual activity: Yes  Other Topics Concern  . Not on file  Social History Narrative  . Not on file   Social Determinants of Health   Financial Resource Strain:   . Difficulty of Paying Living Expenses: Not on file  Food Insecurity:   . Worried About Programme researcher, broadcasting/film/video in the Last Year: Not on file  . Ran Out of Food in the Last Year: Not on file  Transportation Needs:   . Lack of Transportation (Medical): Not on file  . Lack of Transportation (Non-Medical): Not on file  Physical Activity:   . Days of Exercise per Week: Not on file  . Minutes of Exercise per Session: Not on file  Stress:   . Feeling of Stress : Not on file  Social Connections:   . Frequency of Communication with Friends and Family: Not on file  . Frequency of Social Gatherings with Friends and Family: Not on file  . Attends Religious Services: Not on  file  . Active Member of Clubs or Organizations: Not on file  . Attends Banker Meetings: Not on file  . Marital Status: Not on file    Allergies: No Known Allergies  Metabolic Disorder Labs: No results found for: HGBA1C, MPG No results found for: PROLACTIN No results found for: CHOL, TRIG, HDL, CHOLHDL, VLDL, LDLCALC No results found for: TSH  Therapeutic Level Labs: No results found for: LITHIUM No results found for: VALPROATE No components found for:  CBMZ  Current Medications: Current Outpatient Medications  Medication Sig Dispense Refill  . acyclovir (ZOVIRAX) 200 MG capsule Take 200 mg by mouth 2 (two) times daily. Reported on 01/05/2016  3  . ALPRAZolam (XANAX) 0.5 MG tablet TAKE 1 TABLET(0.5 MG) BY MOUTH TWICE DAILY 60 tablet 2  . amphetamine-dextroamphetamine (ADDERALL) 30 MG tablet Take 1 tablet by mouth 2 (two) times daily. 60 tablet 0  . amphetamine-dextroamphetamine (ADDERALL) 30 MG tablet Take 1 tablet by mouth 2 (two) times daily. 60 tablet 0  . amphetamine-dextroamphetamine (ADDERALL) 30 MG tablet Take 1 tablet by mouth 2 (two) times daily. 60 tablet 0  . FLUoxetine (PROZAC) 20 MG capsule Take 1 capsule (20 mg total) by mouth 2 (two) times daily. 60 capsule 2  . lamoTRIgine (LAMICTAL) 100 MG tablet Take 1 tablet (100 mg total) by mouth 2 (two) times daily. 60 tablet 2  . losartan (COZAAR) 25 MG tablet Take 1 tablet (25 mg total) by mouth daily. 30 tablet 6   No current facility-administered medications for this visit.     Musculoskeletal: Strength & Muscle Tone: within normal limits Gait & Station: normal Patient leans: N/A  Psychiatric Specialty Exam: Review of Systems  All other systems reviewed and are negative.   There were no vitals taken for this visit.There is no height or weight on file to calculate BMI.  General Appearance: Casual and Fairly Groomed  Eye Contact:  Good  Speech:  Clear and Coherent  Volume:  Normal  Mood:  Euthymic   Affect:  Appropriate and Congruent  Thought Process:  Goal Directed  Orientation:  Full (Time, Place, and Person)  Thought Content: WDL   Suicidal Thoughts:  No  Homicidal Thoughts:  No  Memory:  Immediate;   Good Recent;   Good Remote;   Good  Judgement:  Good  Insight:  Good  Psychomotor Activity:  Normal  Concentration:  Concentration: Good and Attention Span: Good  Recall:  Good  Fund of Knowledge: Good  Language: Good  Akathisia:  No  Handed:  Right  AIMS (if indicated): not done  Assets:  Communication Skills Desire for Improvement Physical Health Resilience Social Support Talents/Skills  ADL's:  Intact  Cognition: WNL  Sleep:  Good   Screenings:  Assessment and Plan: This patient is a 39 year old male with a history depression anxiety and ADD.  He continues to do well on his current regimen.  He will continue Lamictal 200 mg daily for mood swings, Prozac 20 mg twice daily for depression, Xanax 0.5 mg twice daily for anxiety and Adderall 30 mg twice daily for ADD.  He will return to see me in 3 months   [name], MD 06/20/2020, 10:38 AM

## 2020-08-23 ENCOUNTER — Other Ambulatory Visit (HOSPITAL_COMMUNITY): Payer: Self-pay | Admitting: Psychiatry

## 2020-09-01 ENCOUNTER — Other Ambulatory Visit (HOSPITAL_COMMUNITY): Payer: Self-pay | Admitting: Psychiatry

## 2020-09-19 ENCOUNTER — Telehealth (INDEPENDENT_AMBULATORY_CARE_PROVIDER_SITE_OTHER): Payer: 59 | Admitting: Psychiatry

## 2020-09-19 ENCOUNTER — Other Ambulatory Visit: Payer: Self-pay

## 2020-09-19 ENCOUNTER — Encounter (HOSPITAL_COMMUNITY): Payer: Self-pay | Admitting: Psychiatry

## 2020-09-19 DIAGNOSIS — F411 Generalized anxiety disorder: Secondary | ICD-10-CM

## 2020-09-19 DIAGNOSIS — F900 Attention-deficit hyperactivity disorder, predominantly inattentive type: Secondary | ICD-10-CM | POA: Diagnosis not present

## 2020-09-19 DIAGNOSIS — F321 Major depressive disorder, single episode, moderate: Secondary | ICD-10-CM

## 2020-09-19 MED ORDER — LAMOTRIGINE 100 MG PO TABS
100.0000 mg | ORAL_TABLET | Freq: Two times a day (BID) | ORAL | 2 refills | Status: DC
Start: 2020-09-19 — End: 2020-12-27

## 2020-09-19 MED ORDER — ALPRAZOLAM 0.5 MG PO TABS: ORAL_TABLET | ORAL | 2 refills | Status: DC

## 2020-09-19 MED ORDER — FLUOXETINE HCL 20 MG PO CAPS: 20.0000 mg | ORAL_CAPSULE | Freq: Two times a day (BID) | ORAL | 2 refills | Status: DC

## 2020-09-19 MED ORDER — AMPHETAMINE-DEXTROAMPHETAMINE 30 MG PO TABS
30.0000 mg | ORAL_TABLET | Freq: Two times a day (BID) | ORAL | 0 refills | Status: DC
Start: 2020-09-19 — End: 2020-12-27

## 2020-09-19 NOTE — Progress Notes (Signed)
Virtual Visit via Video Note  I connected with Jason [name] on 09/19/20 at 11:00 AM EST by a video enabled telemedicine application and verified that I am speaking with the correct person using two identifiers.  Location: Patient: home Provider: home   I discussed the limitations of evaluation and management by telemedicine and the availability of in person appointments. The patient expressed understanding and agreed to proceed.      I discussed the assessment and treatment plan with the patient. The patient was provided an opportunity to ask questions and all were answered. The patient agreed with the plan and demonstrated an understanding of the instructions.   The patient was advised to call back or seek an in-person evaluation if the symptoms worsen or if the condition fails to improve as anticipated.  I provided 15 minutes of non-face-to-face time during this encounter.   [name], MD  [facility] MD/PA/NP OP Progress Note  09/19/2020 11:13 AM Jason [name]  MRN:  [id number]  Chief Complaint:  Chief Complaint    ADHD; Anxiety; Depression; Follow-up     HPI: This patient is a 40 year old married white male who lives with his wife in [place].  They have no children.  He is working in a [employer] architect.  The patient returns for follow-up after 3 months regarding depression anxiety mood swings and ADD.  He continues to do well.  He states that he had good holidays.  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  He denies significant depression or anxiety and he is focusing well.  He is sleeping well. Visit Diagnosis:    ICD-10-CM   1. Major depressive disorder, single episode, moderate with anxious distress (HCC)  F32.1   2. Generalized anxiety disorder  F41.1   3. Attention deficit hyperactivity disorder (ADHD), predominantly inattentive type  F90.0     Past Psychiatric History: none  Past Medical  History:  Past Medical History:  Diagnosis Date  . Anxiety disorder   . Elevated cholesterol   . Major depressive disorder    History reviewed. No pertinent surgical history.  Family Psychiatric History: none  Family History:  Family History  Problem Relation Age of Onset  . Hypertension Mother   . Hyperlipidemia Mother   . Hypertension Father   . Heart attack Paternal Grandfather     Social History:  Social History   Socioeconomic History  . Marital status: Married    Spouse name: Not on file  . Number of children: Not on file  . Years of education: Not on file  . Highest education level: Not on file  Occupational History  . Not on file  Tobacco Use  . Smoking status: Former Games developer  . Smokeless tobacco: Never Used  . Tobacco comment: Quit 06-2008  Substance and Sexual Activity  . Alcohol use: No    Alcohol/week: 0.0 standard drinks    Comment: 12-07-15 per pt no  . Drug use: No    Comment: 12-07-2015 per pt no  . Sexual activity: Yes  Other Topics Concern  . Not on file  Social History Narrative  . Not on file   Social Determinants of Health   Financial Resource Strain: Not on file  Food Insecurity: Not on file  Transportation Needs: Not on file  Physical Activity: Not on file  Stress: Not on file  Social Connections: Not on file    Allergies: No  Known Allergies  Metabolic Disorder Labs: No results found for: HGBA1C, MPG No results found for: PROLACTIN No results found for: CHOL, TRIG, HDL, CHOLHDL, VLDL, LDLCALC No results found for: TSH  Therapeutic Level Labs: No results found for: LITHIUM No results found for: VALPROATE No components found for:  CBMZ  Current Medications: Current Outpatient Medications  Medication Sig Dispense Refill  . acyclovir (ZOVIRAX) 200 MG capsule Take 200 mg by mouth 2 (two) times daily. Reported on 01/05/2016  3  . ALPRAZolam (XANAX) 0.5 MG tablet TAKE 1 TABLET(0.5 MG) BY MOUTH TWICE DAILY 60 tablet 2  .  amphetamine-dextroamphetamine (ADDERALL) 30 MG tablet Take 1 tablet by mouth 2 (two) times daily. 60 tablet 0  . amphetamine-dextroamphetamine (ADDERALL) 30 MG tablet Take 1 tablet by mouth 2 (two) times daily. 60 tablet 0  . amphetamine-dextroamphetamine (ADDERALL) 30 MG tablet Take 1 tablet by mouth 2 (two) times daily. 60 tablet 0  . FLUoxetine (PROZAC) 20 MG capsule Take 1 capsule (20 mg total) by mouth 2 (two) times daily. 60 capsule 2  . lamoTRIgine (LAMICTAL) 100 MG tablet Take 1 tablet (100 mg total) by mouth 2 (two) times daily. 60 tablet 2  . losartan (COZAAR) 25 MG tablet Take 1 tablet (25 mg total) by mouth daily. 30 tablet 6   No current facility-administered medications for this visit.     Musculoskeletal: Strength & Muscle Tone: within normal limits Gait & Station: normal Patient leans: N/A  Psychiatric Specialty Exam: Review of Systems  All other systems reviewed and are negative.   There were no vitals taken for this visit.There is no height or weight on file to calculate BMI.  General Appearance: Casual and Fairly Groomed  Eye Contact:  Good  Speech:  Clear and Coherent  Volume:  Normal  Mood:  Euthymic  Affect:  Appropriate and Congruent  Thought Process:  Goal Directed  Orientation:  Full (Time, Place, and Person)  Thought Content: WDL   Suicidal Thoughts:  No  Homicidal Thoughts:  No  Memory:  Immediate;   Good Recent;   Good Remote;   Good  Judgement:  Good  Insight:  Good  Psychomotor Activity:  Normal  Concentration:  Concentration: Good and Attention Span: Good  Recall:  Good  Fund of Knowledge: Good  Language: Good  Akathisia:  No  Handed:  Right  AIMS (if indicated): not done  Assets:  Communication Skills Desire for Improvement Physical Health Resilience Social Support Talents/Skills  ADL's:  Intact  Cognition: WNL  Sleep:  Good   Screenings:   Assessment and Plan: This patient is a 40 year old male with a history of depression  anxiety and ADD.  He continues to do well on his current regimen.  He will continue Lamictal 200 mg daily for mood swings, Prozac 20 mg twice daily for depression, Xanax 0.5 mg twice daily for anxiety and Adderall 30 mg twice daily for ADD.  He will return to see me in 3 months   [name], MD 09/19/2020, 11:13 AM

## 2020-12-03 ENCOUNTER — Other Ambulatory Visit (HOSPITAL_COMMUNITY): Payer: Self-pay | Admitting: Psychiatry

## 2020-12-27 ENCOUNTER — Other Ambulatory Visit: Payer: Self-pay

## 2020-12-27 ENCOUNTER — Telehealth (INDEPENDENT_AMBULATORY_CARE_PROVIDER_SITE_OTHER): Payer: 59 | Admitting: Psychiatry

## 2020-12-27 ENCOUNTER — Encounter (HOSPITAL_COMMUNITY): Payer: Self-pay | Admitting: Psychiatry

## 2020-12-27 DIAGNOSIS — F321 Major depressive disorder, single episode, moderate: Secondary | ICD-10-CM

## 2020-12-27 DIAGNOSIS — F900 Attention-deficit hyperactivity disorder, predominantly inattentive type: Secondary | ICD-10-CM | POA: Diagnosis not present

## 2020-12-27 DIAGNOSIS — F411 Generalized anxiety disorder: Secondary | ICD-10-CM | POA: Diagnosis not present

## 2020-12-27 MED ORDER — AMPHETAMINE-DEXTROAMPHETAMINE 30 MG PO TABS
30.0000 mg | ORAL_TABLET | Freq: Two times a day (BID) | ORAL | 0 refills | Status: DC
Start: 2020-12-27 — End: 2021-05-25

## 2020-12-27 MED ORDER — ALPRAZOLAM 0.5 MG PO TABS: ORAL_TABLET | ORAL | 2 refills | Status: DC

## 2020-12-27 MED ORDER — LAMOTRIGINE 100 MG PO TABS: 100.0000 mg | ORAL_TABLET | Freq: Two times a day (BID) | ORAL | 2 refills | Status: DC

## 2020-12-27 MED ORDER — AMPHETAMINE-DEXTROAMPHETAMINE 30 MG PO TABS: 30.0000 mg | ORAL_TABLET | Freq: Two times a day (BID) | ORAL | 0 refills | Status: DC

## 2020-12-27 MED ORDER — FLUOXETINE HCL 20 MG PO CAPS: 20.0000 mg | ORAL_CAPSULE | Freq: Two times a day (BID) | ORAL | 2 refills | Status: DC

## 2020-12-27 NOTE — Progress Notes (Signed)
Virtual Visit via Video Note  I connected with Jason [name] on 12/27/20 at 11:20 AM EDT by a video enabled telemedicine application and verified that I am speaking with the correct person using two identifiers.  Location: Patient: home Provider: home office   I discussed the limitations of evaluation and management by telemedicine and the availability of in person appointments. The patient expressed understanding and agreed to proceed.   I discussed the assessment and treatment plan with the patient. The patient was provided an opportunity to ask questions and all were answered. The patient agreed with the plan and demonstrated an understanding of the instructions.   The patient was advised to call back or seek an in-person evaluation if the symptoms worsen or if the condition fails to improve as anticipated.  I provided 15 minutes of non-face-to-face time during this encounter.   [name], MD  [facility] MD/PA/NP OP Progress Note  12/27/2020 11:44 AM Jason [name]  MRN:  [id number]  Chief Complaint:  Chief Complaint    Anxiety; Depression; ADD     HPI: This patient is a 40 year old married white male lives with his wife and eating.  They have no children.  He is working in a [employer].  Patient returns for follow-up after 3 months regarding depression anxiety mood swings and ADD.  He states that he is doing very well.  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  He denies significant depression suicidal ideation anxiety mood swings.  He is focusing well.  He is sleeping well and eating well.   Visit Diagnosis:    ICD-10-CM   1. Major depressive disorder, single episode, moderate with anxious distress (HCC)  F32.1   2. Generalized anxiety disorder  F41.1   3. Attention deficit hyperactivity disorder (ADHD), predominantly inattentive type  F90.0     Past Psychiatric History: none  Past Medical History:  Past Medical History:  Diagnosis Date  . Anxiety  disorder   . Elevated cholesterol   . Major depressive disorder    History reviewed. No pertinent surgical history.  Family Psychiatric History: none  Family History:  Family History  Problem Relation Age of Onset  . Hypertension Mother   . Hyperlipidemia Mother   . Hypertension Father   . Heart attack Paternal Grandfather     Social History:  Social History   Socioeconomic History  . Marital status: Married    Spouse name: Not on file  . Number of children: Not on file  . Years of education: Not on file  . Highest education level: Not on file  Occupational History  . Not on file  Tobacco Use  . Smoking status: Former Games developer  . Smokeless tobacco: Never Used  . Tobacco comment: Quit 06-2008  Substance and Sexual Activity  . Alcohol use: No    Alcohol/week: 0.0 standard drinks    Comment: 12-07-15 per pt no  . Drug use: No    Comment: 12-07-2015 per pt no  . Sexual activity: Yes  Other Topics Concern  . Not on file  Social History Narrative  . Not on file   Social Determinants of Health   Financial Resource Strain: Not on file  Food Insecurity: Not on file  Transportation Needs: Not on file  Physical Activity: Not on file  Stress: Not on file  Social Connections: Not on file    Allergies: No Known Allergies  Metabolic Disorder Labs: No results found for: HGBA1C, MPG No results found  for: PROLACTIN No results found for: CHOL, TRIG, HDL, CHOLHDL, VLDL, LDLCALC No results found for: TSH  Therapeutic Level Labs: No results found for: LITHIUM No results found for: VALPROATE No components found for:  CBMZ  Current Medications: Current Outpatient Medications  Medication Sig Dispense Refill  . acyclovir (ZOVIRAX) 200 MG capsule Take 200 mg by mouth 2 (two) times daily. Reported on 01/05/2016  3  . ALPRAZolam (XANAX) 0.5 MG tablet TAKE ONE TABLET BY MOUTH TWICE DAILY 60 tablet 2  . amphetamine-dextroamphetamine (ADDERALL) 30 MG tablet Take 1 tablet by mouth 2  (two) times daily. 60 tablet 0  . amphetamine-dextroamphetamine (ADDERALL) 30 MG tablet Take 1 tablet by mouth 2 (two) times daily. 60 tablet 0  . amphetamine-dextroamphetamine (ADDERALL) 30 MG tablet Take 1 tablet by mouth 2 (two) times daily. 60 tablet 0  . FLUoxetine (PROZAC) 20 MG capsule Take 1 capsule (20 mg total) by mouth 2 (two) times daily. 60 capsule 2  . lamoTRIgine (LAMICTAL) 100 MG tablet Take 1 tablet (100 mg total) by mouth 2 (two) times daily. 60 tablet 2  . losartan (COZAAR) 25 MG tablet Take 1 tablet (25 mg total) by mouth daily. 30 tablet 6   No current facility-administered medications for this visit.     Musculoskeletal: Strength & Muscle Tone: within normal limits Gait & Station: normal Patient leans: N/A  Psychiatric Specialty Exam: Review of Systems  All other systems reviewed and are negative.   There were no vitals taken for this visit.There is no height or weight on file to calculate BMI.  General Appearance: Casual and Fairly Groomed  Eye Contact:  Good  Speech:  Clear and Coherent  Volume:  Normal  Mood:  Euthymic  Affect:  Appropriate and Congruent  Thought Process:  Goal Directed  Orientation:  Full (Time, Place, and Person)  Thought Content: Rumination   Suicidal Thoughts:  No  Homicidal Thoughts:  No  Memory:  Immediate;   Good Recent;   Good Remote;   Good  Judgement:  Good  Insight:  Good  Psychomotor Activity:  Normal  Concentration:  Concentration: Good and Attention Span: Good  Recall:  Good  Fund of Knowledge: Good  Language: Good  Akathisia:  No  Handed:  Right  AIMS (if indicated): not done  Assets:  Communication Skills Desire for Improvement Physical Health Resilience Social Support Talents/Skills  ADL's:  Intact  Cognition: WNL  Sleep:  Good   Screenings:   Assessment and Plan: This patient is a 40 year old male with a history of depression anxiety and ADD as well as mood swings.  He continues to do well on his  current regimen.  He will continue Lamictal 200 mg daily for mood swings, Prozac 20 mg twice daily for depression, Xanax 0.5 mg twice daily for anxiety and Adderall 30 mg twice daily for ADD.  He will return to see me in 3 months   [name], MD 12/27/2020, 11:44 AM

## 2021-02-28 ENCOUNTER — Other Ambulatory Visit (HOSPITAL_COMMUNITY): Payer: Self-pay | Admitting: Psychiatry

## 2021-03-11 ENCOUNTER — Other Ambulatory Visit (HOSPITAL_COMMUNITY): Payer: Self-pay | Admitting: Psychiatry

## 2021-03-30 ENCOUNTER — Encounter (HOSPITAL_COMMUNITY): Payer: Self-pay | Admitting: Psychiatry

## 2021-03-30 ENCOUNTER — Other Ambulatory Visit: Payer: Self-pay

## 2021-03-30 ENCOUNTER — Telehealth (INDEPENDENT_AMBULATORY_CARE_PROVIDER_SITE_OTHER): Payer: 59 | Admitting: Psychiatry

## 2021-03-30 DIAGNOSIS — F321 Major depressive disorder, single episode, moderate: Secondary | ICD-10-CM

## 2021-03-30 DIAGNOSIS — F900 Attention-deficit hyperactivity disorder, predominantly inattentive type: Secondary | ICD-10-CM

## 2021-03-30 DIAGNOSIS — F411 Generalized anxiety disorder: Secondary | ICD-10-CM

## 2021-03-30 MED ORDER — ALPRAZOLAM 0.5 MG PO TABS: 0.5000 mg | ORAL_TABLET | Freq: Three times a day (TID) | ORAL | 2 refills | Status: DC

## 2021-03-30 MED ORDER — FLUOXETINE HCL 20 MG PO CAPS: 20.0000 mg | ORAL_CAPSULE | Freq: Every day | ORAL | 2 refills | Status: DC

## 2021-03-30 MED ORDER — AMPHETAMINE-DEXTROAMPHETAMINE 30 MG PO TABS: 30.0000 mg | ORAL_TABLET | Freq: Two times a day (BID) | ORAL | 0 refills | Status: DC

## 2021-03-30 MED ORDER — FLUOXETINE HCL 40 MG PO CAPS: 40.0000 mg | ORAL_CAPSULE | Freq: Every day | ORAL | 3 refills | Status: DC

## 2021-03-30 MED ORDER — LAMOTRIGINE 100 MG PO TABS: 100.0000 mg | ORAL_TABLET | Freq: Two times a day (BID) | ORAL | 2 refills | Status: DC

## 2021-03-30 NOTE — Progress Notes (Signed)
Virtual Visit via Video Note  I connected with Jason [name] on 03/30/21 at  1:40 PM EDT by a video enabled telemedicine application and verified that I am speaking with the correct person using two identifiers.  Location: Patient: home Provider:  office   I discussed the limitations of evaluation and management by telemedicine and the availability of in person appointments. The patient expressed understanding and agreed to proceed.     I discussed the assessment and treatment plan with the patient. The patient was provided an opportunity to ask questions and all were answered. The patient agreed with the plan and demonstrated an understanding of the instructions.   The patient was advised to call back or seek an in-person evaluation if the symptoms worsen or if the condition fails to improve as anticipated.  I provided 15 minutes of non-face-to-face time during this encounter.   [name], MD  [facility] MD/PA/NP OP Progress Note  03/30/2021 2:00 PM Jason [name]  MRN:  [id number]  Chief Complaint:  Chief Complaint   Anxiety; Depression; ADD; Follow-up    HPI: This patient is a 40 year old married white male lives with his wife in [place].  They have no children.  He works in a Naval architect.  The patient returns for follow-up after 3 months.  He states he has not been doing as well lately.  His dog died at the end of 01-Mar-2023 after 13 years.  This put him in a very low state.  He has been more ruminative and worried lately.  He has been worried a lot about whether his parents and grandfather are going to be next.  He is also been extremely anxious.  He is functioning well at work but being more isolated at home.  He asked if we can go up on the Prozac and Xanax and I think this is reasonable.  He denies any thoughts of self-harm or suicidal ideation. Visit Diagnosis:    ICD-10-CM   1. Major depressive disorder, single episode, moderate with anxious distress (HCC)  F32.1     2.  Generalized anxiety disorder  F41.1     3. Attention deficit hyperactivity disorder (ADHD), predominantly inattentive type  F90.0       Past Psychiatric History: none  Past Medical History:  Past Medical History:  Diagnosis Date   Anxiety disorder    Elevated cholesterol    Major depressive disorder    History reviewed. No pertinent surgical history.  Family Psychiatric History: see below  Family History:  Family History  Problem Relation Age of Onset   Hypertension Mother    Hyperlipidemia Mother    Hypertension Father    Heart attack Paternal Grandfather     Social History:  Social History   Socioeconomic History   Marital status: Married    Spouse name: Not on file   Number of children: Not on file   Years of education: Not on file   Highest education level: Not on file  Occupational History   Not on file  Tobacco Use   Smoking status: Former   Smokeless tobacco: Never   Tobacco comments:    Quit 06-2008  Substance and Sexual Activity   Alcohol use: No    Alcohol/week: 0.0 standard drinks    Comment: 12-07-15 per pt no   Drug use: No    Comment: 12-07-2015 per pt no   Sexual activity: Yes  Other Topics Concern   Not on file  Social History Narrative  Not on file   Social Determinants of Health   Financial Resource Strain: Not on file  Food Insecurity: Not on file  Transportation Needs: Not on file  Physical Activity: Not on file  Stress: Not on file  Social Connections: Not on file    Allergies: No Known Allergies  Metabolic Disorder Labs: No results found for: HGBA1C, MPG No results found for: PROLACTIN No results found for: CHOL, TRIG, HDL, CHOLHDL, VLDL, LDLCALC No results found for: TSH  Therapeutic Level Labs: No results found for: LITHIUM No results found for: VALPROATE No components found for:  CBMZ  Current Medications: Current Outpatient Medications  Medication Sig Dispense Refill   FLUoxetine (PROZAC) 20 MG capsule Take 1  capsule (20 mg total) by mouth daily. 30 capsule 2   FLUoxetine (PROZAC) 40 MG capsule Take 1 capsule (40 mg total) by mouth daily. 30 capsule 3   acyclovir (ZOVIRAX) 200 MG capsule Take 200 mg by mouth 2 (two) times daily. Reported on 01/05/2016  3   ALPRAZolam (XANAX) 0.5 MG tablet Take 1 tablet (0.5 mg total) by mouth 3 (three) times daily. TAKE ONE TABLET BY MOUTH TWICE DAILY 90 tablet 2   amphetamine-dextroamphetamine (ADDERALL) 30 MG tablet Take 1 tablet by mouth 2 (two) times daily. 60 tablet 0   amphetamine-dextroamphetamine (ADDERALL) 30 MG tablet Take 1 tablet by mouth 2 (two) times daily. 60 tablet 0   amphetamine-dextroamphetamine (ADDERALL) 30 MG tablet Take 1 tablet by mouth 2 (two) times daily. 60 tablet 0   lamoTRIgine (LAMICTAL) 100 MG tablet Take 1 tablet (100 mg total) by mouth 2 (two) times daily. 60 tablet 2   losartan (COZAAR) 25 MG tablet Take 1 tablet (25 mg total) by mouth daily. 30 tablet 6   No current facility-administered medications for this visit.     Musculoskeletal: Strength & Muscle Tone: within normal limits Gait & Station: normal Patient leans: N/A  Psychiatric Specialty Exam: Review of Systems  Psychiatric/Behavioral:  Positive for dysphoric mood. The patient is nervous/anxious.   All other systems reviewed and are negative.  There were no vitals taken for this visit.There is no height or weight on file to calculate BMI.  General Appearance: Casual, Neat, and Well Groomed  Eye Contact:  Good  Speech:  Clear and Coherent  Volume:  Normal  Mood:  Anxious and Dysphoric  Affect:  Appropriate  Thought Process:  Goal Directed  Orientation:  Full (Time, Place, and Person)  Thought Content: Rumination   Suicidal Thoughts:  No  Homicidal Thoughts:  No  Memory:  Immediate;   Good Recent;   Good Remote;   Good  Judgement:  Good  Insight:  Good  Psychomotor Activity:  Normal  Concentration:  Concentration: Good and Attention Span: Good  Recall:  Good   Fund of Knowledge: Good  Language: Good  Akathisia:  No  Handed:  Right  AIMS (if indicated): not done  Assets:  Communication Skills Desire for Improvement Physical Health Resilience Social Support Talents/Skills  ADL's:  Intact  Cognition: WNL  Sleep:  Good   Screenings: PHQ2-9    Flowsheet Row Video [facility]30[facility]20[facility]  PHQ-2 Total Score 4  PHQ-9 Total Score 5      Flowsheet Row Video Visit from 03/30/2021 in [facility]  C-SSRS RISK CATEGORY No Risk        Assessment and Plan: This patient is a 40 year old male with a history of depression anxiety  ADD and mood swings.  He has been more depressed lately so increase Prozac to 60 mg daily for depression as well as increase Xanax to 0.5 mg 3 times daily for anxiety.  He will continue Adderall 30 mg twice daily for ADD and Lamictal 200 mg daily for mood swings.  He will return to see me in 4 weeks and we will schedule therapy for him here   [name], MD 03/30/2021, 2:00 PM

## 2021-04-04 ENCOUNTER — Telehealth (HOSPITAL_COMMUNITY): Payer: 59 | Admitting: Psychiatry

## 2021-04-24 ENCOUNTER — Ambulatory Visit (HOSPITAL_COMMUNITY): Payer: 59 | Admitting: Clinical

## 2021-04-27 ENCOUNTER — Encounter (HOSPITAL_COMMUNITY): Payer: Self-pay | Admitting: Psychiatry

## 2021-04-27 ENCOUNTER — Other Ambulatory Visit: Payer: Self-pay

## 2021-04-27 ENCOUNTER — Other Ambulatory Visit (HOSPITAL_COMMUNITY): Payer: Self-pay | Admitting: Psychiatry

## 2021-04-27 ENCOUNTER — Telehealth (INDEPENDENT_AMBULATORY_CARE_PROVIDER_SITE_OTHER): Payer: 59 | Admitting: Psychiatry

## 2021-04-27 DIAGNOSIS — F321 Major depressive disorder, single episode, moderate: Secondary | ICD-10-CM | POA: Diagnosis not present

## 2021-04-27 DIAGNOSIS — F411 Generalized anxiety disorder: Secondary | ICD-10-CM | POA: Diagnosis not present

## 2021-04-27 DIAGNOSIS — F900 Attention-deficit hyperactivity disorder, predominantly inattentive type: Secondary | ICD-10-CM

## 2021-04-27 NOTE — Progress Notes (Signed)
Virtual Visit via Video Note  I connected with Jason [name] on 04/27/21 at  2:20 PM EDT by a video enabled telemedicine application and verified that I am speaking with the correct person using two identifiers.  Location: Patient: home Provider: home office   I discussed the limitations of evaluation and management by telemedicine and the availability of in person appointments. The patient expressed understanding and agreed to proceed.     I discussed the assessment and treatment plan with the patient. The patient was provided an opportunity to ask questions and all were answered. The patient agreed with the plan and demonstrated an understanding of the instructions.   The patient was advised to call back or seek an in-person evaluation if the symptoms worsen or if the condition fails to improve as anticipated.  I provided 15 minutes of non-face-to-face time during this encounter.   [name], MD  [facility] MD/PA/NP OP Progress Note  04/27/2021 2:38 PM Jason [name]  MRN:  [id number]  Chief Complaint:  Chief Complaint   Depression; ADHD; Anxiety; Follow-up    HPI: This patient is a 40 year old married white male who lives with his wife in [place].  They have no children.  He works in a [employer] architect.  The patient returns after 4 weeks.  Last time he stated he was feeling rather low and sad.  He lost his dog of 13 years recently.  He had also been increasingly anxious.  He I did increase his Prozac from 40 to 60 mg and increase Xanax frequency to 0.5 mg 3 times daily.  He is less anxious but still gloomy and depressed at least part of the time.  He states that he has good and bad days.  He is not really sure why.  He feels pretty good at work but when he gets home he ruminates about all the projects he needs to do but does not get anything started.  I urged him to make a schedule at home.  I offered to increase the Prozac even further but he wants to give it more time.  He  denies any thoughts of self-harm or suicidal ideation.  His focus is good. Visit Diagnosis:    ICD-10-CM   1. Major depressive disorder, single episode, moderate with anxious distress (HCC)  F32.1     2. Generalized anxiety disorder  F41.1     3. Attention deficit hyperactivity disorder (ADHD), predominantly inattentive type  F90.0       Past Psychiatric History: none  Past Medical History:  Past Medical History:  Diagnosis Date   Anxiety disorder    Elevated cholesterol    Major depressive disorder    History reviewed. No pertinent surgical history.  Family Psychiatric History: see below  Family History:  Family History  Problem Relation Age of Onset   Hypertension Mother    Hyperlipidemia Mother    Hypertension Father    Heart attack Paternal Grandfather     Social History:  Social History   Socioeconomic History   Marital status: Married    Spouse name: Not on file   Number of children: Not on file   Years of education: Not on file   Highest education level: Not on file  Occupational History   Not on file  Tobacco Use   Smoking status: Former   Smokeless tobacco: Never   Tobacco comments:    Quit 06-2008  Substance and Sexual Activity   Alcohol use: No  Alcohol/week: 0.0 standard drinks    Comment: 12-07-15 per pt no   Drug use: No    Comment: 12-07-2015 per pt no   Sexual activity: Yes  Other Topics Concern   Not on file  Social History Narrative   Not on file   Social Determinants of Health   Financial Resource Strain: Not on file  Food Insecurity: Not on file  Transportation Needs: Not on file  Physical Activity: Not on file  Stress: Not on file  Social Connections: Not on file    Allergies: No Known Allergies  Metabolic Disorder Labs: No results found for: HGBA1C, MPG No results found for: PROLACTIN No results found for: CHOL, TRIG, HDL, CHOLHDL, VLDL, LDLCALC No results found for: TSH  Therapeutic Level Labs: No results found for:  LITHIUM No results found for: VALPROATE No components found for:  CBMZ  Current Medications: Current Outpatient Medications  Medication Sig Dispense Refill   acyclovir (ZOVIRAX) 200 MG capsule Take 200 mg by mouth 2 (two) times daily. Reported on 01/05/2016  3   ALPRAZolam (XANAX) 0.5 MG tablet Take 1 tablet (0.5 mg total) by mouth 3 (three) times daily. TAKE ONE TABLET BY MOUTH TWICE DAILY 90 tablet 2   amphetamine-dextroamphetamine (ADDERALL) 30 MG tablet Take 1 tablet by mouth 2 (two) times daily. 60 tablet 0   amphetamine-dextroamphetamine (ADDERALL) 30 MG tablet Take 1 tablet by mouth 2 (two) times daily. 60 tablet 0   amphetamine-dextroamphetamine (ADDERALL) 30 MG tablet Take 1 tablet by mouth 2 (two) times daily. 60 tablet 0   FLUoxetine (PROZAC) 20 MG capsule Take 1 capsule (20 mg total) by mouth daily. 30 capsule 2   FLUoxetine (PROZAC) 40 MG capsule Take 1 capsule (40 mg total) by mouth daily. 30 capsule 3   lamoTRIgine (LAMICTAL) 100 MG tablet Take 1 tablet (100 mg total) by mouth 2 (two) times daily. 60 tablet 2   losartan (COZAAR) 25 MG tablet Take 1 tablet (25 mg total) by mouth daily. 30 tablet 6   No current facility-administered medications for this visit.     Musculoskeletal: Strength & Muscle Tone: normal Gait & Station: normal Patient leans: N/A  Psychiatric Specialty Exam: Review of Systems  Psychiatric/Behavioral:  Positive for dysphoric mood.   All other systems reviewed and are negative.  There were no vitals taken for this visit.There is no height or weight on file to calculate BMI.  General Appearance: Casual and Fairly Groomed  Eye Contact:  Good  Speech:  Clear and Coherent  Volume:  Normal  Mood:  Dysphoric  Affect:  Flat  Thought Process:  Goal Directed  Orientation:  Full (Time, Place, and Person)  Thought Content: Rumination   Suicidal Thoughts:  No  Homicidal Thoughts:  No  Memory:  Immediate;   Good Recent;   Good Remote;   Fair   Judgement:  Good  Insight:  Fair  Psychomotor Activity:  Normal  Concentration:  Concentration: Good and Attention Span: Good  Recall:  Good  Fund of Knowledge: Good  Language: Good  Akathisia:  No  Handed:  Right  AIMS (if indicated): not done  Assets:  Communication Skills Desire for Improvement Physical Health Resilience Social Support Talents/Skills  ADL's:  Intact  Cognition: WNL  Sleep:  Good   Screenings: PHQ2-9    Flowsheet Row Video Visit from [facility]27/2021 [facility] Video Visit from 03/30/2021 in [facility]  PHQ-2 Total Score 2 4  PHQ-9 Total  Score 3 5      Flowsheet Row Video Visit from 04/27/2021 in [facility] Video Visit from 03/30/2021 in [facility]  C-SSRS RISK CATEGORY No Risk No Risk        Assessment and Plan: This patient is a 40 year old male with a history of depression anxiety ADD and mood swings.  He would like to give the medication changes more time so we will continue Prozac 60 mg daily for depression and Xanax 0.5 mg 3 times daily for anxiety.  He will continue Adderall 30 mg twice daily for ADD and Lamictal 200 mg daily for mood swings.  He will return to see me in 4 weeks   [name], MD 04/27/2021, 2:38 PM

## 2021-05-03 ENCOUNTER — Telehealth (HOSPITAL_COMMUNITY): Payer: Self-pay | Admitting: *Deleted

## 2021-05-03 NOTE — Telephone Encounter (Signed)
It was sent on 9/3

## 2021-05-03 NOTE — Telephone Encounter (Signed)
Patient called requesting refills for his Adderall. Per pt Jason [name] said they don't have the script.

## 2021-05-04 ENCOUNTER — Other Ambulatory Visit (HOSPITAL_COMMUNITY): Payer: Self-pay | Admitting: Psychiatry

## 2021-05-04 MED ORDER — AMPHETAMINE-DEXTROAMPHETAMINE 30 MG PO TABS: 30.0000 mg | ORAL_TABLET | Freq: Two times a day (BID) | ORAL | 0 refills | Status: DC

## 2021-05-04 NOTE — Telephone Encounter (Signed)
Sent!

## 2021-05-04 NOTE — Telephone Encounter (Signed)
Pharmacy said they don't have it. Pharmacy said for provider to please send it again because they don't have it.

## 2021-05-08 ENCOUNTER — Ambulatory Visit (HOSPITAL_COMMUNITY): Payer: 59 | Admitting: Clinical

## 2021-05-22 ENCOUNTER — Ambulatory Visit (INDEPENDENT_AMBULATORY_CARE_PROVIDER_SITE_OTHER): Payer: 59 | Admitting: Clinical

## 2021-05-22 ENCOUNTER — Other Ambulatory Visit: Payer: Self-pay

## 2021-05-22 DIAGNOSIS — F419 Anxiety disorder, unspecified: Secondary | ICD-10-CM | POA: Diagnosis not present

## 2021-05-22 DIAGNOSIS — F331 Major depressive disorder, recurrent, moderate: Secondary | ICD-10-CM | POA: Diagnosis not present

## 2021-05-22 DIAGNOSIS — F900 Attention-deficit hyperactivity disorder, predominantly inattentive type: Secondary | ICD-10-CM | POA: Diagnosis not present

## 2021-05-22 NOTE — Progress Notes (Signed)
Virtual Visit via Video Note  I connected with Jason [name] on 05/22/21 at  3:00 PM EDT by a video enabled telemedicine application and verified that I am speaking with the correct person using two identifiers.  Location: Patient: Home Provider: Office   I discussed the limitations of evaluation and management by telemedicine and the availability of in person appointments. The patient expressed understanding and agreed to proceed.   Comprehensive Clinical Assessment (CCA) Note  05/22/2021 Jason [name] [id number]  Chief Complaint: Depression and Anxiety Visit Diagnosis: Recurrent Moderate Depression with Anxiety   CCA Screening, Triage and Referral (STR)  Patient Reported Information How did you hear about [place]? No data recorded Referral name: No data recorded Referral phone number: No data recorded  Whom do you see for routine medical problems? No data recorded Practice/Facility Name: No data recorded Practice/Facility Phone Number: No data recorded Name of Contact: No data recorded Contact Number: No data recorded Contact Fax Number: No data recorded Prescriber Name: No data recorded Prescriber Address (if known): No data recorded  What Is the Reason for Your Visit/Call Today? No data recorded How Long Has This Been Causing You Problems? No data recorded What Do You Feel Would Help You the Most Today? No data recorded  Have You Recently Been in Any Inpatient Treatment (Hospital/Detox/Crisis Center/28-Day Program)? No data recorded Name/Location of Program/Hospital:No data recorded How Long Were You There? No data recorded When Were You Discharged? No data recorded  Have You Ever Received Services From [facility] Before? No data recorded Who Do You See at [facility]? No data recorded  Have You Recently Had Any Thoughts About Hurting Yourself? No data recorded Are You Planning to Commit Suicide/Harm Yourself At This time? No data recorded  Have you  Recently Had Thoughts About Hurting Someone Karolee Ohs? No data recorded Explanation: No data recorded  Have You Used Any Alcohol or Drugs in the Past 24 Hours? No data recorded How Long Ago Did You Use Drugs or Alcohol? No data recorded What Did You Use and How Much? No data recorded  Do You Currently Have a Therapist/Psychiatrist? No data recorded Name of Therapist/Psychiatrist: No data recorded  Have You Been Recently Discharged From Any Office Practice or Programs? No data recorded Explanation of Discharge From Practice/Program: No data recorded    CCA Screening Triage Referral Assessment Type of Contact: No data recorded Is this Initial or Reassessment? No data recorded Date Telepsych consult ordered in [facility]:  No data recorded Time Telepsych consult ordered in [facility]:  No data recorded  Patient Reported Information Reviewed? No data recorded Patient Left Without Being Seen? No data recorded Reason for Not Completing Assessment: No data recorded  Collateral Involvement: No data recorded  Does Patient Have a Court Appointed Legal Guardian? No data recorded Name and Contact of Legal Guardian: No data recorded If Minor and Not Living with Parent(s), Who has Custody? No data recorded Is CPS involved or ever been involved? No data recorded Is APS involved or ever been involved? No data recorded  Patient Determined To Be At Risk for Harm To Self or Others Based on Review of Patient Reported Information or Presenting Complaint? No data recorded Method: No data recorded Availability of Means: No data recorded Intent: No data recorded Notification Required: No data recorded Additional Information for Danger to Others Potential: No data recorded Additional Comments for Danger to Others Potential: No data recorded Are There Guns or Other Weapons in Your Home? No data  recorded Types of Guns/Weapons: No data recorded Are These Weapons Safely Secured?                            No data  recorded Who Could Verify You Are Able To Have These Secured: No data recorded Do You Have any Outstanding Charges, Pending Court Dates, Parole/Probation? No data recorded Contacted To Inform of Risk of Harm To Self or Others: No data recorded  Location of Assessment: No data recorded  Does Patient Present under Involuntary Commitment? No data recorded IVC Papers Initial File Date: No data recorded  [place] of Residence: No data recorded  Patient Currently Receiving the Following Services: No data recorded  Determination of Need: No data recorded  Options For Referral: No data recorded    CCA Biopsychosocial Intake/Chief Complaint:  Depression (Patient is a 40 year old Caucasian male that presents oriented x5 (person, place, situation, time and object), alert, anxious, average height, average weight, casually dressed, and cooperative)  Current Symptoms/Problems: The patient continues to stuggle with anxiety and depression symptoms although this has been improving with his work with Dr. Tenny Craw   Patient Reported Schizophrenia/Schizoaffective Diagnosis in Past: No   Strengths: Art, musician, painting, remodeling, likes to learn, geniune, honest  Preferences: Writing music , drawing, and painting  Abilities: Art, musician, painting, remodeling, likes to learn, can work on cars, geniune, honest   Type of Services Patient Feels are Needed: Medication management, and trying outpatient therapy    Initial Clinical Notes/Concerns: Symptoms started around age 49 or 68, he felt somewhat ashamed of being raised in a poor family, symptoms occur daily, symptoms mild to moderate . 05/22/2021- Ongoing treatment for depression and Anxiety.   Mental Health Symptoms Depression:   Change in energy/activity; Difficulty Concentrating; Fatigue; Irritability   Duration of Depressive symptoms:  Greater than two weeks   Mania:   N/A   Anxiety:    Worrying; Tension; Irritability; Difficulty  concentrating   Psychosis:   None   Duration of Psychotic symptoms: None  Trauma:   N/A   Obsessions:   N/A   Compulsions:   N/A   Inattention:   N/A   Hyperactivity/Impulsivity:   N/A   Oppositional/Defiant Behaviors:   N/A   Emotional Irregularity:   N/A   Other Mood/Personality Symptoms:   None     Mental Status Exam Appearance and self-care  Stature:   Average   Weight:   Average weight   Clothing:   Casual   Grooming:   Normal   Cosmetic use:   None   Posture/gait:   Normal   Motor activity:   Not Remarkable   Sensorium  Attention:   Normal   Concentration:   Normal   Orientation:   X5   Recall/memory:   Normal   Affect and Mood  Affect:   Anxious   Mood:   Anxious   Relating  Eye contact:   Normal   Facial expression:   Responsive   Attitude toward examiner:   Cooperative   Thought and Language  Speech flow:  Normal   Thought content:  Normal  Preoccupation:   -- (None)   Hallucinations:   -- (None)   Organization:  Logical  Company secretary of Knowledge:   Average   Intelligence:   Average   Abstraction:   Normal   Judgement:   Normal   Reality Testing:   Adequate   Insight:  Good   Decision Making:   Normal   Social Functioning  Social Maturity:   Responsible   Social Judgement:   Normal   Stress  Stressors:   Illness; Work; Veterinary surgeon (The patient identifies his dog passing away recently. high blood / high chloesteral)   Coping Ability:   Human resources officer Deficits:   None   Supports:   Family; Friends/Service system (Wife and Mother)     Religion: Religion/Spirituality Are You A Religious Person?: No How Might This Affect Treatment?: NA  Leisure/Recreation: Leisure / Recreation Do You Have Hobbies?: Yes Leisure and Hobbies: Music art and wooodwork  Exercise/Diet: Exercise/Diet Do You Exercise?: Yes What Type of Exercise Do You Do?:  Run/Walk How Many Times a Week Do You Exercise?: 4-5 times a week Have You Gained or Lost A Significant Amount of Weight in the Past Six Months?: No Do You Follow a Special Diet?: No Do You Have Any Trouble Sleeping?: Yes Explanation of Sleeping Difficulties: The patient currenly takes Meletonin for sleep   CCA Employment/Education Employment/Work Situation: Employment / Work Situation Employment Situation: Employed 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?: No  Education: Education Is Patient Currently Attending School?: No Last Grade Completed: 12 Name of High School: [employer]  Did Garment/textile technologist From [employer]?: Yes Did Theme park manager?: Yes (Attended a year of college) What Type of College Degree Do you Have?: Designer, television/film set with [employer] Did You Attend Graduate School?: No What Was Your Major?: NA Did You Have Any Special Interests In School?: Art Did You Have An Individualized Education Program (IIEP): No Did You Have Any Difficulty At School?: Yes Were Any Medications Ever Prescribed For These Difficulties?: No Patient's Education Has Been Impacted by Current Illness: No   CCA Family/Childhood History Family and Relationship History: Family history Marital status: Married Number of Years Married: 13 What types of issues is patient dealing with in the relationship?: None Additional relationship information: None Are you sexually active?: Yes What is your sexual orientation?: Heterosexual Has your sexual activity been affected by  drugs, alcohol, medication, or emotional stress?: Emotional stress  Does patient have children?: No  Childhood History:  Childhood History By whom was/is the patient raised?: Both parents Additional childhood history information: Grew up in lower income home, parents divorced when patient was 22  Description of patient's relationship with caregiver when they were a child: Good relationship with parents Patient's description of current relationship with people who raised him/her: The patient notes , " I have a good relationship with both parents". How were you disciplined when you got in trouble as a child/adolescent?: Occasional spankings, grounding  Does patient have siblings?: Yes Number of Siblings: 1 Description of patient's current relationship with siblings: The patient notes, " I have a good relationship with my older sister and she is a special needs". Did patient suffer any verbal/emotional/physical/sexual abuse as a child?: No Did patient suffer from severe childhood neglect?: No Has patient ever been sexually abused/assaulted/raped as an adolescent or adult?: No Was the patient ever a victim  of a crime or a disaster?: No Witnessed domestic violence?: No Has patient been affected by domestic violence as an adult?: No  Child/Adolescent Assessment:     CCA Substance Use Alcohol/Drug Use: Alcohol / Drug Use Pain Medications: See patient record Prescriptions: See patient record Over the Counter: Meletonin, vit C/D History of alcohol / drug use?: No history of alcohol / drug abuse (Drinks on occasion) Longest period of sobriety (when/how long): NA                         ASAM's:  Six Dimensions of Multidimensional Assessment  Dimension 1:  Acute Intoxication and/or Withdrawal Potential:      Dimension 2:  Biomedical Conditions and Complications:      Dimension 3:  Emotional, Behavioral, or Cognitive Conditions and Complications:     Dimension 4:  Readiness to  Change:     Dimension 5:  Relapse, Continued use, or Continued Problem Potential:     Dimension 6:  Recovery/Living Environment:     ASAM Severity Score:    ASAM Recommended Level of Treatment:     Substance use Disorder (SUD)    Recommendations for Services/Supports/Treatments: Recommendations for Services/Supports/Treatments Recommendations For Services/Supports/Treatments: Individual Therapy, Medication Management  DSM5 Diagnoses: Patient Active Problem List   Diagnosis Date Noted   Generalized anxiety disorder 12/07/2015    Patient Centered Plan: Patient is on the following Treatment Plan(s):  Recurrent Moderate Depression with Anxiety / ADHD per hx   Referrals to Alternative Service(s): Referred to Alternative Service(s):   Place:   Date:   Time:    Referred to Alternative Service(s):   Place:   Date:   Time:    Referred to Alternative Service(s):   Place:   Date:   Time:    Referred to Alternative Service(s):   Place:   Date:   Time:     Winfred Burn, LCSW

## 2021-05-25 ENCOUNTER — Encounter (HOSPITAL_COMMUNITY): Payer: Self-pay | Admitting: Psychiatry

## 2021-05-25 ENCOUNTER — Other Ambulatory Visit: Payer: Self-pay

## 2021-05-25 ENCOUNTER — Telehealth (INDEPENDENT_AMBULATORY_CARE_PROVIDER_SITE_OTHER): Payer: 59 | Admitting: Psychiatry

## 2021-05-25 DIAGNOSIS — F331 Major depressive disorder, recurrent, moderate: Secondary | ICD-10-CM | POA: Diagnosis not present

## 2021-05-25 DIAGNOSIS — F900 Attention-deficit hyperactivity disorder, predominantly inattentive type: Secondary | ICD-10-CM

## 2021-05-25 DIAGNOSIS — F411 Generalized anxiety disorder: Secondary | ICD-10-CM

## 2021-05-25 DIAGNOSIS — F419 Anxiety disorder, unspecified: Secondary | ICD-10-CM | POA: Diagnosis not present

## 2021-05-25 MED ORDER — FLUOXETINE HCL 20 MG PO CAPS: 20.0000 mg | ORAL_CAPSULE | Freq: Every day | ORAL | 2 refills | Status: DC

## 2021-05-25 MED ORDER — LAMOTRIGINE 100 MG PO TABS: 100.0000 mg | ORAL_TABLET | Freq: Two times a day (BID) | ORAL | 2 refills | Status: DC

## 2021-05-25 MED ORDER — ALPRAZOLAM 0.5 MG PO TABS: 0.5000 mg | ORAL_TABLET | Freq: Three times a day (TID) | ORAL | 2 refills | Status: DC | PRN

## 2021-05-25 MED ORDER — AMPHETAMINE-DEXTROAMPHETAMINE 30 MG PO TABS: 30.0000 mg | ORAL_TABLET | Freq: Two times a day (BID) | ORAL | 0 refills | Status: DC

## 2021-05-25 MED ORDER — FLUOXETINE HCL 40 MG PO CAPS: 40.0000 mg | ORAL_CAPSULE | Freq: Every day | ORAL | 3 refills | Status: DC

## 2021-05-25 NOTE — Progress Notes (Signed)
Virtual Visit via Video Note  I connected with Jason [name] on 05/25/21 at  2:20 PM EDT by a video enabled telemedicine application and verified that I am speaking with the correct person using two identifiers.  Location: Patient: home Provider: home office   I discussed the limitations of evaluation and management by telemedicine and the availability of in person appointments. The patient expressed understanding and agreed to proceed.     I discussed the assessment and treatment plan with the patient. The patient was provided an opportunity to ask questions and all were answered. The patient agreed with the plan and demonstrated an understanding of the instructions.   The patient was advised to call back or seek an in-person evaluation if the symptoms worsen or if the condition fails to improve as anticipated.  I provided 16 minutes of non-face-to-face time during this encounter.   [name], MD  [facility] MD/PA/NP OP Progress Note  05/25/2021 2:35 PM Jason [name]  MRN:  [id number]  Chief Complaint:  Chief Complaint   Depression; Anxiety; ADD; Follow-up    HPI: This patient is a 40 year old married white male who lives with his wife in [place].  They have no children.  He works in a warehouse  The patient returns for follow-up after about 4 weeks.  He states that he is feeling better.  About 8 weeks ago I increased his Prozac because he was increasingly depressed.  Is taken some time but is finally starting to feel better.  This was also in the context of losing his dog of 13 years recently.  He states that his mood is better he is trying to do more things.  His work is going well.  He is regaining all of his old interest.  Generally he is sleeping well.  He is staying well focused with the Adderall.  He denies any thoughts of self-harm or suicidal ideation. Visit Diagnosis:    ICD-10-CM   1. Recurrent moderate major depressive disorder with anxiety (HCC)  F33.1     F41.9     2. Attention deficit hyperactivity disorder (ADHD), predominantly inattentive type  F90.0     3. Generalized anxiety disorder  F41.1       Past Psychiatric History: none  Past Medical History:  Past Medical History:  Diagnosis Date   Anxiety disorder    Elevated cholesterol    Major depressive disorder    History reviewed. No pertinent surgical history.  Family Psychiatric History: see below  Family History:  Family History  Problem Relation Age of Onset   Hypertension Mother    Hyperlipidemia Mother    Hypertension Father    Heart attack Paternal Grandfather     Social History:  Social History   Socioeconomic History   Marital status: Married    Spouse name: Not on file   Number of children: Not on file   Years of education: Not on file   Highest education level: Not on file  Occupational History   Not on file  Tobacco Use   Smoking status: Former   Smokeless tobacco: Never   Tobacco comments:    Quit 06-2008  Substance and Sexual Activity   Alcohol use: No    Alcohol/week: 0.0 standard drinks    Comment: 12-07-15 per pt no   Drug use: No    Comment: 12-07-2015 per pt no   Sexual activity: Yes  Other Topics Concern   Not on file  Social History Narrative  Not on file   Social Determinants of Health   Financial Resource Strain: Not on file  Food Insecurity: Not on file  Transportation Needs: Not on file  Physical Activity: Not on file  Stress: Not on file  Social Connections: Not on file    Allergies: No Known Allergies  Metabolic Disorder Labs: No results found for: HGBA1C, MPG No results found for: PROLACTIN No results found for: CHOL, TRIG, HDL, CHOLHDL, VLDL, LDLCALC No results found for: TSH  Therapeutic Level Labs: No results found for: LITHIUM No results found for: VALPROATE No components found for:  CBMZ  Current Medications: Current Outpatient Medications  Medication Sig Dispense Refill   acyclovir (ZOVIRAX) 200 MG  capsule Take 200 mg by mouth 2 (two) times daily. Reported on 01/05/2016  3   ALPRAZolam (XANAX) 0.5 MG tablet Take 1 tablet (0.5 mg total) by mouth 3 (three) times daily as needed for anxiety. 90 tablet 2   amphetamine-dextroamphetamine (ADDERALL) 30 MG tablet Take 1 tablet by mouth 2 (two) times daily. 60 tablet 0   amphetamine-dextroamphetamine (ADDERALL) 30 MG tablet Take 1 tablet by mouth 2 (two) times daily. 60 tablet 0   amphetamine-dextroamphetamine (ADDERALL) 30 MG tablet Take 1 tablet by mouth 2 (two) times daily. 60 tablet 0   FLUoxetine (PROZAC) 20 MG capsule Take 1 capsule (20 mg total) by mouth daily. 30 capsule 2   FLUoxetine (PROZAC) 40 MG capsule Take 1 capsule (40 mg total) by mouth daily. 30 capsule 3   lamoTRIgine (LAMICTAL) 100 MG tablet Take 1 tablet (100 mg total) by mouth 2 (two) times daily. 60 tablet 2   losartan (COZAAR) 25 MG tablet Take 1 tablet (25 mg total) by mouth daily. 30 tablet 6   No current facility-administered medications for this visit.     Musculoskeletal: Strength & Muscle Tone: within normal limits Gait & Station: normal Patient leans: N/A  Psychiatric Specialty Exam: Review of Systems  All other systems reviewed and are negative.  There were no vitals taken for this visit.There is no height or weight on file to calculate BMI.  General Appearance: Casual and Fairly Groomed  Eye Contact:  Good  Speech:  Clear and Coherent  Volume:  Normal  Mood:  Euthymic  Affect:  Appropriate and Congruent  Thought Process:  Goal Directed  Orientation:  Full (Time, Place, and Person)  Thought Content: Rumination   Suicidal Thoughts:  No  Homicidal Thoughts:  No  Memory:  Immediate;   Good Recent;   Good Remote;   Good  Judgement:  Good  Insight:  Good  Psychomotor Activity:  Normal  Concentration:  Concentration: Good and Attention Span: Good  Recall:  Good  Fund of Knowledge: Good  Language: Good  Akathisia:  No  Handed:  Right  AIMS (if  indicated): not done  Assets:  Communication Skills Desire for Improvement Physical Health Resilience Social Support Talents/Skills  ADL's:  Intact  Cognition: WNL  Sleep:  Good   Screenings: GAD-7    Flowsheet Row Counselor from [facility]/22/2021 [facility]  Total GAD-7 Score 12      PHQ2-9    Flowsheet Row Video Visit from 05/25/2021 in [facility] Counselor from 05/22/2021 in [facility] Video Visit from 04/27/2021 in [facility] Video Visit from 03/30/2021 [facility]  PHQ-2 Total Score 0 2 2 4   PHQ-9 Total Score -- 5 3 5  Flowsheet Row Video Visit from 05/25/2021 in [facility] Counselor from 05/22/2021 in [facility] Video Visit from 04/27/2021 in [facility]  C-SSRS RISK CATEGORY No Risk No Risk No Risk        Assessment and Plan: This patient is a 40 year old male with a history of depression anxiety ADD and mood swings.  He seems to be doing better on the increased dose of Prozac so we will continue 60 mg daily for depression.  He will also continue Xanax 0.5 mg 3 times daily for anxiety, Adderall 30 mg twice daily for ADD and Lamictal 200 mg daily for mood swings.  He will return to see me in 3 months   [name], MD 05/25/2021, 2:35 PM

## 2021-06-23 ENCOUNTER — Telehealth (HOSPITAL_COMMUNITY): Payer: 59 | Admitting: Psychiatry
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
## 2021-07-25 ENCOUNTER — Other Ambulatory Visit (HOSPITAL_COMMUNITY): Payer: Self-pay | Admitting: Psychiatry

## 2021-08-10 ENCOUNTER — Other Ambulatory Visit (HOSPITAL_COMMUNITY): Payer: Self-pay | Admitting: Psychiatry

## 2021-08-16 ENCOUNTER — Telehealth (INDEPENDENT_AMBULATORY_CARE_PROVIDER_SITE_OTHER): Payer: 59 | Admitting: Psychiatry

## 2021-08-16 ENCOUNTER — Encounter (HOSPITAL_COMMUNITY): Payer: Self-pay | Admitting: Psychiatry

## 2021-08-16 ENCOUNTER — Other Ambulatory Visit: Payer: Self-pay

## 2021-08-16 DIAGNOSIS — F331 Major depressive disorder, recurrent, moderate: Secondary | ICD-10-CM | POA: Diagnosis not present

## 2021-08-16 DIAGNOSIS — F900 Attention-deficit hyperactivity disorder, predominantly inattentive type: Secondary | ICD-10-CM | POA: Diagnosis not present

## 2021-08-16 DIAGNOSIS — F411 Generalized anxiety disorder: Secondary | ICD-10-CM | POA: Diagnosis not present

## 2021-08-16 DIAGNOSIS — F419 Anxiety disorder, unspecified: Secondary | ICD-10-CM | POA: Diagnosis not present

## 2021-08-16 MED ORDER — AMPHETAMINE-DEXTROAMPHETAMINE 30 MG PO TABS: 30.0000 mg | ORAL_TABLET | Freq: Two times a day (BID) | ORAL | 0 refills | Status: DC

## 2021-08-16 MED ORDER — LAMOTRIGINE 100 MG PO TABS: 100.0000 mg | ORAL_TABLET | Freq: Two times a day (BID) | ORAL | 2 refills | Status: DC

## 2021-08-16 MED ORDER — FLUOXETINE HCL 40 MG PO CAPS: 40.0000 mg | ORAL_CAPSULE | Freq: Every day | ORAL | 3 refills | Status: DC

## 2021-08-16 MED ORDER — ALPRAZOLAM 0.5 MG PO TABS: 0.5000 mg | ORAL_TABLET | Freq: Three times a day (TID) | ORAL | 2 refills | Status: DC | PRN

## 2021-08-16 MED ORDER — FLUOXETINE HCL 20 MG PO CAPS: 20.0000 mg | ORAL_CAPSULE | Freq: Every day | ORAL | 2 refills | Status: DC

## 2021-08-16 NOTE — Progress Notes (Signed)
Virtual Visit via Video Note  I connected with Jason [name] on 08/16/21 at  9:40 AM EST by a video enabled telemedicine application and verified that I am speaking with the correct person using two identifiers.  Location: Patient: home Provider: office   I discussed the limitations of evaluation and management by telemedicine and the availability of in person appointments. The patient expressed understanding and agreed to proceed.    I discussed the assessment and treatment plan with the patient. The patient was provided an opportunity to ask questions and all were answered. The patient agreed with the plan and demonstrated an understanding of the instructions.   The patient was advised to call back or seek an in-person evaluation if the symptoms worsen or if the condition fails to improve as anticipated.  I provided 12 minutes of non-face-to-face time during this encounter.   [name], MD  [facility] MD/PA/NP OP Progress Note  08/16/2021 9:53 AM Jason [name]  MRN:  [id number]  Chief Complaint:  Chief Complaint   ADD; Depression; Anxiety; Follow-up    HPI: This patient is a 40 year old married white male who lives with his wife in [place].  They have no children.  He works in a warehouse  The patient returns for follow-up after 3 months.  He states that he is doing very well "the best I felt in a long time."  His mood has been stable he denies significant depression or anxiety.  He is sleeping well focusing well.  He denies any thoughts of self-harm or suicidal ideation.  He is focusing well with the Adderall.  He is still enjoying his job. Visit Diagnosis:    ICD-10-CM   1. Recurrent moderate major depressive disorder with anxiety (HCC)  F33.1    F41.9     2. Attention deficit hyperactivity disorder (ADHD), predominantly inattentive type  F90.0     3. Generalized anxiety disorder  F41.1       Past Psychiatric History: none  Past Medical History:  Past  Medical History:  Diagnosis Date   Anxiety disorder    Elevated cholesterol    Major depressive disorder    History reviewed. No pertinent surgical history.  Family Psychiatric History: see below  Family History:  Family History  Problem Relation Age of Onset   Hypertension Mother    Hyperlipidemia Mother    Hypertension Father    Heart attack Paternal Grandfather     Social History:  Social History   Socioeconomic History   Marital status: Married    Spouse name: Not on file   Number of children: Not on file   Years of education: Not on file   Highest education level: Not on file  Occupational History   Not on file  Tobacco Use   Smoking status: Former   Smokeless tobacco: Never   Tobacco comments:    Quit 06-2008  Substance and Sexual Activity   Alcohol use: No    Alcohol/week: 0.0 standard drinks    Comment: 12-07-15 per pt no   Drug use: No    Comment: 12-07-2015 per pt no   Sexual activity: Yes  Other Topics Concern   Not on file  Social History Narrative   Not on file   Social Determinants of Health   Financial Resource Strain: Not on file  Food Insecurity: Not on file  Transportation Needs: Not on file  Physical Activity: Not on file  Stress: Not on file  Social Connections: Not on  file    Allergies: No Known Allergies  Metabolic Disorder Labs: No results found for: HGBA1C, MPG No results found for: PROLACTIN No results found for: CHOL, TRIG, HDL, CHOLHDL, VLDL, LDLCALC No results found for: TSH  Therapeutic Level Labs: No results found for: LITHIUM No results found for: VALPROATE No components found for:  CBMZ  Current Medications: Current Outpatient Medications  Medication Sig Dispense Refill   acyclovir (ZOVIRAX) 200 MG capsule Take 200 mg by mouth 2 (two) times daily. Reported on 01/05/2016  3   ALPRAZolam (XANAX) 0.5 MG tablet Take 1 tablet (0.5 mg total) by mouth 3 (three) times daily as needed for anxiety. 90 tablet 2    amphetamine-dextroamphetamine (ADDERALL) 30 MG tablet Take 1 tablet by mouth 2 (two) times daily. 60 tablet 0   amphetamine-dextroamphetamine (ADDERALL) 30 MG tablet Take 1 tablet by mouth 2 (two) times daily. 60 tablet 0   amphetamine-dextroamphetamine (ADDERALL) 30 MG tablet Take 1 tablet by mouth 2 (two) times daily. 60 tablet 0   FLUoxetine (PROZAC) 20 MG capsule Take 1 capsule (20 mg total) by mouth daily. 30 capsule 2   FLUoxetine (PROZAC) 40 MG capsule Take 1 capsule (40 mg total) by mouth daily. 30 capsule 3   lamoTRIgine (LAMICTAL) 100 MG tablet Take 1 tablet (100 mg total) by mouth 2 (two) times daily. 60 tablet 2   losartan (COZAAR) 25 MG tablet Take 1 tablet (25 mg total) by mouth daily. 30 tablet 6   No current facility-administered medications for this visit.     Musculoskeletal: Strength & Muscle Tone: within normal limits Gait & Station: normal Patient leans: N/A  Psychiatric Specialty Exam: Review of Systems  All other systems reviewed and are negative.  There were no vitals taken for this visit.There is no height or weight on file to calculate BMI.  General Appearance: Casual and Fairly Groomed  Eye Contact:  Good  Speech:  Clear and Coherent  Volume:  Normal  Mood:  Euthymic  Affect:  Appropriate and Congruent  Thought Process:  Goal Directed  Orientation:  Full (Time, Place, and Person)  Thought Content: WDL   Suicidal Thoughts:  No  Homicidal Thoughts:  No  Memory:  Immediate;   Good Recent;   Good Remote;   Fair  Judgement:  Good  Insight:  Good  Psychomotor Activity:  Normal  Concentration:  Concentration: Good and Attention Span: Good  Recall:  Good  Fund of Knowledge: Good  Language: Good  Akathisia:  No  Handed:  Right  AIMS (if indicated): not done  Assets:  Communication Skills Desire for Improvement Physical Health Resilience Social Support Talents/Skills  ADL's:  Intact  Cognition: WNL  Sleep:  Good   Screenings: GAD-7     Flowsheet Row Counselor from [facility]/22/2021 [facility]  Total GAD-7 Score 12      PHQ2-9    Flowsheet Row Video Visit from 08/16/2021 in [facility] Video Visit from 05/25/2021 in [facility] Counselor from 05/22/2021 in [facility] Video Visit from 04/27/2021 in [facility] Video Visit from 03/30/2021 in [facility]  PHQ-2 Total Score 0 0 2 2 4   PHQ-9 Total Score -- -- 5 3 5       Flowsheet Row Video Visit from 08/16/2021 in [facility] Video Visit from 05/25/2021 in [facility] Counselor from 05/22/2021 in [facility]  C-SSRS RISK CATEGORY No Risk No Risk No Risk        Assessment and Plan: This patient is a 40 year old male with a history of depression anxiety ADD and mood swings.  He is continuing to do well on his current regimen.  He will continue Prozac 60 mg daily for depression, Xanax 0.5 mg 3 times daily for anxiety, Adderall 30 mg twice daily for ADD and Lamictal 200 mg daily for mood swings.  He will return to see me in 3 months   [name], MD 08/16/2021, 9:53 AM

## 2021-08-24 ENCOUNTER — Telehealth (HOSPITAL_COMMUNITY): Payer: 59 | Admitting: Psychiatry

## 2021-09-27 ENCOUNTER — Telehealth (HOSPITAL_COMMUNITY): Payer: Self-pay | Admitting: Psychiatry

## 2021-09-27 NOTE — Telephone Encounter (Signed)
Called to schedule f/u appt unable to leave vm  °

## 2021-10-12 ENCOUNTER — Other Ambulatory Visit (HOSPITAL_COMMUNITY): Payer: Self-pay | Admitting: Psychiatry

## 2021-10-20 ENCOUNTER — Other Ambulatory Visit (HOSPITAL_COMMUNITY): Payer: Self-pay | Admitting: Psychiatry

## 2021-11-14 ENCOUNTER — Encounter (HOSPITAL_COMMUNITY): Payer: Self-pay | Admitting: Psychiatry

## 2021-11-14 ENCOUNTER — Telehealth (INDEPENDENT_AMBULATORY_CARE_PROVIDER_SITE_OTHER): Payer: 59 | Admitting: Psychiatry

## 2021-11-14 ENCOUNTER — Other Ambulatory Visit: Payer: Self-pay

## 2021-11-14 DIAGNOSIS — F419 Anxiety disorder, unspecified: Secondary | ICD-10-CM

## 2021-11-14 DIAGNOSIS — F900 Attention-deficit hyperactivity disorder, predominantly inattentive type: Secondary | ICD-10-CM

## 2021-11-14 DIAGNOSIS — F411 Generalized anxiety disorder: Secondary | ICD-10-CM | POA: Diagnosis not present

## 2021-11-14 DIAGNOSIS — F331 Major depressive disorder, recurrent, moderate: Secondary | ICD-10-CM | POA: Diagnosis not present

## 2021-11-14 MED ORDER — AMPHETAMINE-DEXTROAMPHETAMINE 30 MG PO TABS: 30.0000 mg | ORAL_TABLET | Freq: Two times a day (BID) | ORAL | 0 refills | Status: DC

## 2021-11-14 MED ORDER — ALPRAZOLAM 0.5 MG PO TABS: 0.5000 mg | ORAL_TABLET | Freq: Three times a day (TID) | ORAL | 2 refills | Status: DC | PRN

## 2021-11-14 MED ORDER — LAMOTRIGINE 100 MG PO TABS: 100.0000 mg | ORAL_TABLET | Freq: Two times a day (BID) | ORAL | 2 refills | Status: DC

## 2021-11-14 MED ORDER — FLUOXETINE HCL 40 MG PO CAPS: 40.0000 mg | ORAL_CAPSULE | Freq: Every day | ORAL | 3 refills | Status: DC

## 2021-11-14 MED ORDER — FLUOXETINE HCL 20 MG PO CAPS: 20.0000 mg | ORAL_CAPSULE | Freq: Every day | ORAL | 2 refills | Status: DC

## 2021-11-14 NOTE — Progress Notes (Signed)
Virtual Visit via Video Note ? ?I connected with Jason [name] on 11/14/21 at  9:00 AM EDT by a video enabled telemedicine application and verified that I am speaking with the correct person using two identifiers. ? ?Location: ?Patient: home ?Provider: office ?  ?I discussed the limitations of evaluation and management by telemedicine and the availability of in person appointments. The patient expressed understanding and agreed to proceed. ? ? ?  ?I discussed the assessment and treatment plan with the patient. The patient was provided an opportunity to ask questions and all were answered. The patient agreed with the plan and demonstrated an understanding of the instructions. ?  ?The patient was advised to call back or seek an in-person evaluation if the symptoms worsen or if the condition fails to improve as anticipated. ? ?I provided 15 minutes of non-face-to-face time during this encounter. ? ? ?[name], MD ? ?BH MD/PA/NP OP Progress Note ? ?11/14/2021 9:20 AM ?Jason [name]  ?MRN:  [id number] ? ?Chief Complaint:  ?Chief Complaint  ?Patient presents with  ? Anxiety  ? Depression  ? Follow-up  ? ADHD  ? ?HPI: This patient is a 41 year old married white male who lives with his wife in [place].  They have no children.  He works in a warehouse ? ?The patient returns for follow-up after 3 months.  He states overall he is doing very well.  He denies any significant depression or anxiety.  He is sleeping well.  He is still enjoying his job.  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  He denies any thoughts of self-harm or suicidal ideation.  His focus is good with the Adderall ?Visit Diagnosis:  ?  ICD-10-CM   ?1. Recurrent moderate major depressive disorder with anxiety (HCC)  F33.1   ? F41.9   ?  ?2. Attention deficit hyperactivity disorder (ADHD), predominantly inattentive type  F90.0   ?  ?3. Generalized anxiety disorder  F41.1   ?  ? ? ?Past Psychiatric History: none ? ?Past Medical  History:  ?Past Medical History:  ?Diagnosis Date  ? Anxiety disorder   ? Elevated cholesterol   ? Major depressive disorder   ? History reviewed. No pertinent surgical history. ? ?Family Psychiatric History: None ? ?Family History:  ?Family History  ?Problem Relation Age of Onset  ? Hypertension Mother   ? Hyperlipidemia Mother   ? Hypertension Father   ? Heart attack Paternal Grandfather   ? ? ?Social History:  ?Social History  ? ?Socioeconomic History  ? Marital status: Married  ?  Spouse name: Not on file  ? Number of children: Not on file  ? Years of education: Not on file  ? Highest education level: Not on file  ?Occupational History  ? Not on file  ?Tobacco Use  ? Smoking status: Former  ? Smokeless tobacco: Never  ? Tobacco comments:  ?  Quit 06-2008  ?Substance and Sexual Activity  ? Alcohol use: No  ?  Alcohol/week: 0.0 standard drinks  ?  Comment: 12-07-15 per pt no  ? Drug use: No  ?  Comment: 12-07-2015 per pt no  ? Sexual activity: Yes  ?Other Topics Concern  ? Not on file  ?Social History Narrative  ? Not on file  ? ?Social Determinants of Health  ? ?Financial Resource Strain: Not on file  ?Food Insecurity: Not on file  ?Transportation Needs: Not on file  ?Physical Activity: Not on file  ?  Stress: Not on file  ?Social Connections: Not on file  ? ? ?Allergies: No Known Allergies ? ?Metabolic Disorder Labs: ?No results found for: HGBA1C, MPG ?No results found for: PROLACTIN ?No results found for: CHOL, TRIG, HDL, CHOLHDL, VLDL, LDLCALC ?No results found for: TSH ? ?Therapeutic Level Labs: ?No results found for: LITHIUM ?No results found for: VALPROATE ?No components found for:  CBMZ ? ?Current Medications: ?Current Outpatient Medications  ?Medication Sig Dispense Refill  ? acyclovir (ZOVIRAX) 200 MG capsule Take 200 mg by mouth 2 (two) times daily. Reported on 01/05/2016  3  ? ALPRAZolam (XANAX) 0.5 MG tablet Take 1 tablet (0.5 mg total) by mouth 3 (three) times daily as needed. for anxiety 90 tablet 2  ?  amphetamine-dextroamphetamine (ADDERALL) 30 MG tablet Take 1 tablet by mouth 2 (two) times daily. 60 tablet 0  ? amphetamine-dextroamphetamine (ADDERALL) 30 MG tablet Take 1 tablet by mouth 2 (two) times daily. 60 tablet 0  ? amphetamine-dextroamphetamine (ADDERALL) 30 MG tablet Take 1 tablet by mouth 2 (two) times daily. 60 tablet 0  ? FLUoxetine (PROZAC) 20 MG capsule Take 1 capsule (20 mg total) by mouth daily. 30 capsule 2  ? FLUoxetine (PROZAC) 40 MG capsule Take 1 capsule (40 mg total) by mouth daily. 30 capsule 3  ? lamoTRIgine (LAMICTAL) 100 MG tablet Take 1 tablet (100 mg total) by mouth 2 (two) times daily. 60 tablet 2  ? losartan (COZAAR) 25 MG tablet Take 1 tablet (25 mg total) by mouth daily. 30 tablet 6  ? ?No current facility-administered medications for this visit.  ? ? ? ?Musculoskeletal: ?Strength & Muscle Tone: within normal limits ?Gait & Station: normal ?Patient leans: N/A ? ?Psychiatric Specialty Exam: ?Review of Systems  ?All other systems reviewed and are negative.  ?There were no vitals taken for this visit.There is no height or weight on file to calculate BMI.  ?General Appearance: Casual and Fairly Groomed  ?Eye Contact:  Good  ?Speech:  Clear and Coherent  ?Volume:  Normal  ?Mood:  Euthymic  ?Affect:  Appropriate and Congruent  ?Thought Process:  Goal Directed  ?Orientation:  Full (Time, Place, and Person)  ?Thought Content: WDL   ?Suicidal Thoughts:  No  ?Homicidal Thoughts:  No  ?Memory:  Immediate;   Good ?Recent;   Good ?Remote;   Good  ?Judgement:  Good  ?Insight:  Fair  ?Psychomotor Activity:  Normal  ?Concentration:  Concentration: Good and Attention Span: Good  ?Recall:  Good  ?Fund of Knowledge: Good  ?Language: Good  ?Akathisia:  No  ?Handed:  Right  ?AIMS (if indicated): not done  ?Assets:  Communication Skills ?Desire for Improvement ?Physical Health ?Resilience ?Social Support ?Talents/Skills ?Vocational/Educational  ?ADL's:  Intact  ?Cognition: WNL  ?Sleep:  Good   ? ?Screenings: ?GAD-7   ? ?Flowsheet Row Counselor from 05/22/2021 in [facility]  ?Total GAD-7 Score 12  ? ?  ? ?PHQ2-9   ? ?Flowsheet Row Video Visit from 11/14/2021 in [facility] Video Visit from 08/16/2021 in [facility] Video Visit from 05/25/2021 in [facility] Counselor from 05/22/2021 in [facility] Video Visit from 04/27/2021 in [facility]  ?PHQ-2 Total Score 0 0 0 2 2  ?PHQ-9 Total Score -- -- -- 5 3  ? ?  ? ?Flowsheet Row Video Visit from 11/14/2021 in [facility] Video Visit from 08/16/2021 in [facility] Video Visit from 05/25/2021 in [facility]  ?C-SSRS RISK CATEGORY No Risk No Risk No Risk  ? ?  ? ? ? ?Assessment and Plan: This patient is a 41 year old male with a history of depression anxiety ADD and mood swings.  He continues to do well on his current regimen.  He will continue Prozac 60 mg daily for depression, Xanax 0.5 mg 3 times daily for anxiety, Adderall 30 mg twice daily for ADD and Lamictal 200 mg daily for mood swings.  He will return to see me in 3 months ? ?Collaboration of Care: Collaboration of Care: Primary Care Provider AEB chart notes will be provided to primary care physician at patient's request ? ?Patient/Guardian was advised Release of Information must be obtained prior to any record release in order to collaborate their care with an outside provider. Patient/Guardian was advised if they have not already done so to contact the registration department to sign all necessary forms in order for [place] to release information regarding their care.  ? ?Consent: Patient/Guardian gives verbal consent for treatment  and assignment of benefits for services provided during this visit. Patient/Guardian expressed understanding and agreed to proceed.  ? ? ?[name], MD ?11/14/2021, 9:20 AM ? ?

## 2021-11-16 ENCOUNTER — Telehealth (HOSPITAL_COMMUNITY): Payer: Self-pay

## 2021-11-16 NOTE — Telephone Encounter (Signed)
Simonne Come from [place] called and wanted to inform you that this patient is getting Valium from a different provider as well as the Alprazolam (Xanax) you're prescribing to him ?

## 2021-11-17 NOTE — Telephone Encounter (Signed)
Stanton Kidney, please make appt for this pt next week to discuss. Pam, please call [facility] in [place] to inform [place] PA to sto prescribing the valium

## 2021-11-17 NOTE — Telephone Encounter (Signed)
DONE - RELAYED MESSAGE TO [facility] IN [place] ?

## 2022-01-10 ENCOUNTER — Other Ambulatory Visit (HOSPITAL_COMMUNITY): Payer: Self-pay | Admitting: Psychiatry

## 2022-01-15 ENCOUNTER — Other Ambulatory Visit (HOSPITAL_COMMUNITY): Payer: Self-pay | Admitting: Psychiatry

## 2022-01-29 ENCOUNTER — Ambulatory Visit (INDEPENDENT_AMBULATORY_CARE_PROVIDER_SITE_OTHER): Payer: 59 | Admitting: Psychiatry

## 2022-01-29 ENCOUNTER — Encounter (HOSPITAL_COMMUNITY): Payer: Self-pay | Admitting: Psychiatry

## 2022-01-29 VITALS — BP 124/80 | HR 80 | Temp 97.3°F | Ht 65.0 in | Wt 152.6 lb

## 2022-01-29 DIAGNOSIS — F900 Attention-deficit hyperactivity disorder, predominantly inattentive type: Secondary | ICD-10-CM

## 2022-01-29 DIAGNOSIS — F331 Major depressive disorder, recurrent, moderate: Secondary | ICD-10-CM | POA: Diagnosis not present

## 2022-01-29 DIAGNOSIS — F411 Generalized anxiety disorder: Secondary | ICD-10-CM | POA: Diagnosis not present

## 2022-01-29 DIAGNOSIS — F419 Anxiety disorder, unspecified: Secondary | ICD-10-CM | POA: Diagnosis not present

## 2022-01-29 MED ORDER — LAMOTRIGINE 100 MG PO TABS: 100.0000 mg | ORAL_TABLET | Freq: Three times a day (TID) | ORAL | 2 refills | Status: DC

## 2022-01-29 MED ORDER — ALPRAZOLAM 0.5 MG PO TABS: 0.5000 mg | ORAL_TABLET | Freq: Three times a day (TID) | ORAL | 2 refills | Status: DC | PRN

## 2022-01-29 MED ORDER — AMPHETAMINE-DEXTROAMPHETAMINE 30 MG PO TABS: 30.0000 mg | ORAL_TABLET | Freq: Two times a day (BID) | ORAL | 0 refills | Status: DC

## 2022-01-29 MED ORDER — FLUOXETINE HCL 20 MG PO CAPS
20.0000 mg | ORAL_CAPSULE | Freq: Every day | ORAL | 2 refills | Status: DC
Start: 2022-01-29 — End: 2022-04-11

## 2022-01-29 MED ORDER — FLUOXETINE HCL 40 MG PO CAPS: 40.0000 mg | ORAL_CAPSULE | Freq: Every day | ORAL | 3 refills | Status: DC

## 2022-01-29 NOTE — Progress Notes (Signed)
BH MD/PA/NP OP Progress Note  01/29/2022 10:21 AM Jason [name]  MRN:  [id number]  Chief Complaint:  Chief Complaint  Patient presents with   Depression   Anxiety   ADD   Follow-up   HPI: This patient is a 41year-old married white male who lives with his wife in [place].  They have no children.  He works in a warehouse  The patient returns for follow-up after 3 months.  This time he is here in person.  He states overall he is doing well.  However he notes that every 2 weeks or so he gets a period of feeling somewhat down but not seriously so.  It usually lasts a few days.  He states that Lamictal has helped to some degree.  I suggested that we go up from 200 to 300 mg daily to see if we can get the mood swings to even.  Overall he is doing well.  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  He denies thoughts of self-harm or suicidal ideation.  He strives to stay upbeat.  His focus is good with the Adderall and his anxiety is under good control. Visit Diagnosis:    ICD-10-CM   1. Recurrent moderate major depressive disorder with anxiety (HCC)  F33.1    F41.9     2. Attention deficit hyperactivity disorder (ADHD), predominantly inattentive type  F90.0     3. Generalized anxiety disorder  F41.1       Past Psychiatric History: none  Past Medical History:  Past Medical History:  Diagnosis Date   Anxiety disorder    Elevated cholesterol    Major depressive disorder    History reviewed. No pertinent surgical history.  Family Psychiatric History: none  Family History:  Family History  Problem Relation Age of Onset   Hypertension Mother    Hyperlipidemia Mother    Hypertension Father    Heart attack Paternal Grandfather     Social History:  Social History   Socioeconomic History   Marital status: Married    Spouse name: Not on file   Number of children: Not on file   Years of education: Not on file   Highest education level: Not on file  Occupational  History   Not on file  Tobacco Use   Smoking status: Former   Smokeless tobacco: Never   Tobacco comments:    Quit 06-2008  Substance and Sexual Activity   Alcohol use: No    Alcohol/week: 0.0 standard drinks    Comment: 12-07-15 per pt no   Drug use: No    Comment: 12-07-2015 per pt no   Sexual activity: Yes  Other Topics Concern   Not on file  Social History Narrative   Not on file   Social Determinants of Health   Financial Resource Strain: Not on file  Food Insecurity: Not on file  Transportation Needs: Not on file  Physical Activity: Not on file  Stress: Not on file  Social Connections: Not on file    Allergies: No Known Allergies  Metabolic Disorder Labs: No results found for: HGBA1C, MPG No results found for: PROLACTIN No results found for: CHOL, TRIG, HDL, CHOLHDL, VLDL, LDLCALC No results found for: TSH  Therapeutic Level Labs: No results found for: LITHIUM No results found for: VALPROATE No components found for:  CBMZ  Current Medications: Current Outpatient Medications  Medication Sig Dispense Refill   acyclovir (ZOVIRAX) 200 MG capsule Take 200 mg by  mouth 2 (two) times daily. Reported on 01/05/2016  3   lamoTRIgine (LAMICTAL) 100 MG tablet TAKE ONE TABLET BY MOUTH TWICE DAILY 60 tablet 2   lamoTRIgine (LAMICTAL) 100 MG tablet Take 1 tablet (100 mg total) by mouth 3 (three) times daily. 90 tablet 2   losartan (COZAAR) 25 MG tablet Take 1 tablet (25 mg total) by mouth daily. 30 tablet 6   ALPRAZolam (XANAX) 0.5 MG tablet Take 1 tablet (0.5 mg total) by mouth 3 (three) times daily as needed. for anxiety 90 tablet 2   amphetamine-dextroamphetamine (ADDERALL) 30 MG tablet Take 1 tablet by mouth 2 (two) times daily. 60 tablet 0   amphetamine-dextroamphetamine (ADDERALL) 30 MG tablet Take 1 tablet by mouth 2 (two) times daily. 60 tablet 0   amphetamine-dextroamphetamine (ADDERALL) 30 MG tablet Take 1 tablet by mouth 2 (two) times daily. 60 tablet 0   FLUoxetine  (PROZAC) 20 MG capsule Take 1 capsule (20 mg total) by mouth daily. 30 capsule 2   FLUoxetine (PROZAC) 40 MG capsule Take 1 capsule (40 mg total) by mouth daily. 30 capsule 3   No current facility-administered medications for this visit.     Musculoskeletal: Strength & Muscle Tone: within normal limits Gait & Station: normal Patient leans: N/A  Psychiatric Specialty Exam: Review of Systems  Psychiatric/Behavioral:  Positive for dysphoric mood.   All other systems reviewed and are negative.  Blood pressure 124/80, pulse 80, temperature (!) 97.3 F (36.3 C), temperature source Temporal, height 5\' 5"  (1.651 m), weight 152 lb 9.6 oz (69.2 kg), SpO2 99 %.Body mass index is 25.39 kg/m.  General Appearance: Casual and Fairly Groomed  Eye Contact:  Good  Speech:  Clear and Coherent  Volume:  Normal  Mood:  Euthymic  Affect:  Appropriate and Congruent  Thought Process:  Goal Directed  Orientation:  Full (Time, Place, and Person)  Thought Content: WDL   Suicidal Thoughts:  No  Homicidal Thoughts:  No  Memory:  Immediate;   Good Recent;   Good Remote;   Good  Judgement:  Good  Insight:  Good  Psychomotor Activity:  Normal  Concentration:  Concentration: Good and Attention Span: Good  Recall:  Good  Fund of Knowledge: Good  Language: Good  Akathisia:  No  Handed:  Right  AIMS (if indicated): not done  Assets:  Communication Skills Desire for Improvement Physical Health Resilience Social Support Talents/Skills Vocational/Educational  ADL's:  Intact  Cognition: WNL  Sleep:  Good   Screenings: GAD-7    Flowsheet Row Office Visit from [facility]29[facility]2022 [facility] Counselor from 05/22/2021 in [facility]  Total GAD-7 Score 3 12      PHQ2-9    Flowsheet Row Office Visit from 01/29/2022 in [facility] Video Visit from 11/14/2021 in [facility] Video Visit from 08/16/2021 in [facility] Video Visit from 05/25/2021 in [facility] Counselor from 05/22/2021 in [facility]  PHQ-2 Total Score 0 0 0 0 2  PHQ-9 Total Score 3 -- -- -- 5      Flowsheet Row Office Visit from 01/29/2022 in [facility] Video Visit from 11/14/2021 in [facility] Video Visit from 08/16/2021 in [facility]  C-SSRS RISK CATEGORY No Risk No Risk No Risk        Assessment and Plan: This patient is a  41 year old male with a history depression anxiety ADD and mood swings.  He states he is still having mood swings with intermittent periods of dysphoria.  We will therefore increase Lamictal to 300 mg daily for mood stabilization.  He will continue Prozac 60 mg daily for depression, Xanax 0.5 mg 3 times daily for anxiety Adderall 30 mg twice daily for ADD.  He will return to see me in 3 months  Collaboration of Care: Collaboration of Care: Primary Care Provider AEB notes will be released to PCP at patient's request  Patient/Guardian was advised Release of Information must be obtained prior to any record release in order to collaborate their care with an outside provider. Patient/Guardian was advised if they have not already done so to contact the registration department to sign all necessary forms in order for [place] to release information regarding their care.   Consent: Patient/Guardian gives verbal consent for treatment and assignment of benefits for services provided during this visit. Patient/Guardian expressed understanding and agreed to proceed.    [name], MD 01/29/2022, 10:21 AM

## 2022-02-02 ENCOUNTER — Telehealth (HOSPITAL_COMMUNITY): Payer: Self-pay | Admitting: *Deleted

## 2022-02-02 ENCOUNTER — Other Ambulatory Visit (HOSPITAL_COMMUNITY): Payer: Self-pay | Admitting: Psychiatry

## 2022-02-02 ENCOUNTER — Encounter (HOSPITAL_COMMUNITY): Payer: Self-pay

## 2022-02-02 MED ORDER — AMPHETAMINE-DEXTROAMPHETAMINE 15 MG PO TABS: 30.0000 mg | ORAL_TABLET | Freq: Two times a day (BID) | ORAL | 0 refills | Status: DC

## 2022-02-02 NOTE — Telephone Encounter (Signed)
sent 

## 2022-02-02 NOTE — Telephone Encounter (Signed)
Patient called stating that his pharmacy do not have the 30 mg for his Adderall in stock.   Per pt on voicemail, [facility] told him they have 15mg  in stock.  Per pt, Pharmacy informed him that provider would have to send 2 of the 15mg  twice a day.

## 2022-02-05 NOTE — Telephone Encounter (Signed)
LMOM

## 2022-04-11 ENCOUNTER — Other Ambulatory Visit (HOSPITAL_COMMUNITY): Payer: Self-pay | Admitting: Psychiatry

## 2022-04-13 ENCOUNTER — Other Ambulatory Visit (HOSPITAL_COMMUNITY): Payer: Self-pay | Admitting: Psychiatry

## 2022-05-01 ENCOUNTER — Telehealth (HOSPITAL_COMMUNITY): Payer: 59 | Admitting: Psychiatry

## 2022-05-01 ENCOUNTER — Ambulatory Visit (HOSPITAL_COMMUNITY): Payer: 59 | Admitting: Psychiatry

## 2022-05-01 ENCOUNTER — Encounter (HOSPITAL_COMMUNITY): Payer: Self-pay | Admitting: Psychiatry

## 2022-05-01 ENCOUNTER — Telehealth (INDEPENDENT_AMBULATORY_CARE_PROVIDER_SITE_OTHER): Payer: 59 | Admitting: Psychiatry

## 2022-05-01 DIAGNOSIS — F411 Generalized anxiety disorder: Secondary | ICD-10-CM

## 2022-05-01 DIAGNOSIS — F900 Attention-deficit hyperactivity disorder, predominantly inattentive type: Secondary | ICD-10-CM

## 2022-05-01 DIAGNOSIS — F331 Major depressive disorder, recurrent, moderate: Secondary | ICD-10-CM | POA: Diagnosis not present

## 2022-05-01 MED ORDER — ALPRAZOLAM 0.5 MG PO TABS
0.5000 mg | ORAL_TABLET | Freq: Three times a day (TID) | ORAL | 2 refills | Status: DC | PRN
Start: 2022-05-01 — End: 2022-06-11

## 2022-05-01 MED ORDER — AMPHETAMINE-DEXTROAMPHETAMINE 30 MG PO TABS: 30.0000 mg | ORAL_TABLET | Freq: Two times a day (BID) | ORAL | 0 refills | Status: DC

## 2022-05-01 MED ORDER — FLUOXETINE HCL 40 MG PO CAPS: 80.0000 mg | ORAL_CAPSULE | Freq: Every day | ORAL | 3 refills | Status: DC

## 2022-05-01 MED ORDER — LAMOTRIGINE 100 MG PO TABS
100.0000 mg | ORAL_TABLET | Freq: Two times a day (BID) | ORAL | 2 refills | Status: DC
Start: 2022-05-01 — End: 2022-06-11

## 2022-05-01 NOTE — Progress Notes (Signed)
Virtual Visit via Telephone Note  I connected with Jason [name] on 05/01/22 at 11:00 AM EDT by telephone and verified that I am speaking with the correct person using two identifiers.  Location: Patient: home Provider: office   I discussed the limitations, risks, security and privacy concerns of performing an evaluation and management service by telephone and the availability of in person appointments. I also discussed with the patient that there may be a patient responsible charge related to this service. The patient expressed understanding and agreed to proceed.      I discussed the assessment and treatment plan with the patient. The patient was provided an opportunity to ask questions and all were answered. The patient agreed with the plan and demonstrated an understanding of the instructions.   The patient was advised to call back or seek an in-person evaluation if the symptoms worsen or if the condition fails to improve as anticipated.  I provided 15 minutes of non-face-to-face time during this encounter.   [name], MD  [facility] MD/PA/NP OP Progress Note  05/01/2022 11:21 AM Jason [name]  MRN:  [id number]  Chief Complaint:  Chief Complaint  Patient presents with   Depression   Anxiety   ADD   Follow-up   HPI: This patient is a 41year-old married white male who lives with his wife in [place].  They have no children.  He works in a warehouse  The patient returns for follow-up after 3 months regarding his depression anxiety and ADD as well as mood swings.  Last time he thought he was having more ups and downs in mood.  We tried increasing Lamictal to 300 mg but it made him more irritable and blunted and he is gone back to 200 mg daily.  He states he does have periods of depression feeling well motivation and then feeling guilty about doing things he enjoys like playing music or drawing.  I think he would benefit from working with a therapist.  He also agrees to  increasing his Prozac to 80 mg.  He denies thoughts of self-harm or suicide but states he goes to episodic periods of just feeling down about everything.  His focus is still good with the Adderall Visit Diagnosis:    ICD-10-CM   1. Recurrent moderate major depressive disorder with anxiety (HCC)  F33.1    F41.9     2. Attention deficit hyperactivity disorder (ADHD), predominantly inattentive type  F90.0     3. Generalized anxiety disorder  F41.1       Past Psychiatric History: none  Past Medical History:  Past Medical History:  Diagnosis Date   Anxiety disorder    Elevated cholesterol    Major depressive disorder    History reviewed. No pertinent surgical history.  Family Psychiatric History: see below  Family History:  Family History  Problem Relation Age of Onset   Hypertension Mother    Hyperlipidemia Mother    Hypertension Father    Heart attack Paternal Grandfather     Social History:  Social History   Socioeconomic History   Marital status: Married    Spouse name: Not on file   Number of children: Not on file   Years of education: Not on file   Highest education level: Not on file  Occupational History   Not on file  Tobacco Use   Smoking status: Former   Smokeless tobacco: Never   Tobacco comments:    Quit 06-2008  Substance and Sexual  Activity   Alcohol use: No    Alcohol/week: 0.0 standard drinks of alcohol    Comment: 12-07-15 per pt no   Drug use: No    Comment: 12-07-2015 per pt no   Sexual activity: Yes  Other Topics Concern   Not on file  Social History Narrative   Not on file   Social Determinants of Health   Financial Resource Strain: Not on file  Food Insecurity: Not on file  Transportation Needs: Not on file  Physical Activity: Not on file  Stress: Not on file  Social Connections: Not on file    Allergies: No Known Allergies  Metabolic Disorder Labs: No results found for: "HGBA1C", "MPG" No results found for: "PROLACTIN" No  results found for: "CHOL", "TRIG", "HDL", "CHOLHDL", "VLDL", "LDLCALC" No results found for: "TSH"  Therapeutic Level Labs: No results found for: "LITHIUM" No results found for: "VALPROATE" No results found for: "CBMZ"  Current Medications: Current Outpatient Medications  Medication Sig Dispense Refill   acyclovir (ZOVIRAX) 200 MG capsule Take 200 mg by mouth 2 (two) times daily. Reported on 01/05/2016  3   ALPRAZolam (XANAX) 0.5 MG tablet Take 1 tablet (0.5 mg total) by mouth 3 (three) times daily as needed. for anxiety 90 tablet 2   amphetamine-dextroamphetamine (ADDERALL) 30 MG tablet Take 1 tablet by mouth 2 (two) times daily. 60 tablet 0   amphetamine-dextroamphetamine (ADDERALL) 30 MG tablet Take 1 tablet by mouth 2 (two) times daily. 60 tablet 0   FLUoxetine (PROZAC) 40 MG capsule Take 2 capsules (80 mg total) by mouth daily. 60 capsule 3   lamoTRIgine (LAMICTAL) 100 MG tablet Take 1 tablet (100 mg total) by mouth 2 (two) times daily. 60 tablet 2   losartan (COZAAR) 25 MG tablet Take 1 tablet (25 mg total) by mouth daily. 30 tablet 6   No current facility-administered medications for this visit.     Musculoskeletal: Strength & Muscle Tone: na Gait & Station: na Patient leans: N/A  Psychiatric Specialty Exam: Review of Systems  Psychiatric/Behavioral:  Positive for dysphoric mood.   All other systems reviewed and are negative.   There were no vitals taken for this visit.There is no height or weight on file to calculate BMI.  General Appearance: NA  Eye Contact:  NA  Speech:  Clear and Coherent  Volume:  Normal  Mood:  Dysphoric  Affect:  NA  Thought Process:  Goal Directed  Orientation:  Full (Time, Place, and Person)  Thought Content: Rumination   Suicidal Thoughts:  No  Homicidal Thoughts:  No  Memory:  Immediate;   Good Recent;   Good Remote;   Good  Judgement:  Good  Insight:  Fair  Psychomotor Activity:  Decreased  Concentration:  Concentration: Good and  Attention Span: Good  Recall:  Good  Fund of Knowledge: Good  Language: Good  Akathisia:  No  Handed:  Right  AIMS (if indicated): not done  Assets:  Communication Skills Desire for Improvement Physical Health Resilience Social Support Talents/Skills  ADL's:  Intact  Cognition: WNL  Sleep:  Good   Screenings: GAD-7    Flowsheet Row [facility] Counselor from 05/22/2021 [facility]  Total GAD-7 Score 3 12      PHQ2-9    Flowsheet Row Video Visit from 05/01/2022 in [facility] Office Visit from 01/29/2022 in [facility] Video Visit from [facility]/14[facility]2021 [facility] Video Visit from 08/16/2021 in [facility] Video Visit from 05/25/2021 in [facility]  PHQ-2 Total Score 5 0 0 0 0  PHQ-9 Total Score 6 3 -- -- --      Flowsheet Row Video Visit from 05/01/2022 in [facility] Office Visit from 01/29/2022 in [facility] Video Visit from 11/14/2021 in [facility]  C-SSRS RISK CATEGORY No Risk No Risk No Risk        Assessment and Plan: This patient is a 40 year old male with a history of depression anxiety ADD and mood swings.  He is still dysphoric at times and therefore we will schedule therapy.  He will also increase Prozac to 80 mg daily.  He will continue Lamictal 200 mg daily for mood stabilization, Xanax 0.5 mg 3 times daily for anxiety and Adderall 30 mg twice daily for ADD.  He will return to see me in 6 weeks  Collaboration of Care: Collaboration of Care: Referral or follow-up with counselor/therapist AEB patient will be scheduled for  therapy with Florencia Reasons  Patient/Guardian was advised Release of Information must be obtained prior to any record release in order to collaborate their care with an outside provider. Patient/Guardian was advised if they have not already done so to contact the registration department to sign all necessary forms in order for [place] to release information regarding their care.   Consent: Patient/Guardian gives verbal consent for treatment and assignment of benefits for services provided during this visit. Patient/Guardian expressed understanding and agreed to proceed.    [name], MD 05/01/2022, 11:21 AM

## 2022-05-10 ENCOUNTER — Other Ambulatory Visit (HOSPITAL_COMMUNITY): Payer: Self-pay | Admitting: Psychiatry

## 2022-05-22 ENCOUNTER — Ambulatory Visit (HOSPITAL_COMMUNITY): Payer: 59 | Admitting: Psychiatry

## 2022-06-11 ENCOUNTER — Telehealth (INDEPENDENT_AMBULATORY_CARE_PROVIDER_SITE_OTHER): Payer: 59 | Admitting: Psychiatry

## 2022-06-11 ENCOUNTER — Encounter (HOSPITAL_COMMUNITY): Payer: Self-pay | Admitting: Psychiatry

## 2022-06-11 ENCOUNTER — Ambulatory Visit (INDEPENDENT_AMBULATORY_CARE_PROVIDER_SITE_OTHER): Payer: 59 | Admitting: Psychiatry

## 2022-06-11 DIAGNOSIS — F331 Major depressive disorder, recurrent, moderate: Secondary | ICD-10-CM

## 2022-06-11 DIAGNOSIS — F419 Anxiety disorder, unspecified: Secondary | ICD-10-CM

## 2022-06-11 DIAGNOSIS — F900 Attention-deficit hyperactivity disorder, predominantly inattentive type: Secondary | ICD-10-CM

## 2022-06-11 DIAGNOSIS — F411 Generalized anxiety disorder: Secondary | ICD-10-CM

## 2022-06-11 MED ORDER — ALPRAZOLAM 0.5 MG PO TABS: 0.5000 mg | ORAL_TABLET | Freq: Three times a day (TID) | ORAL | 2 refills | Status: DC | PRN

## 2022-06-11 MED ORDER — FLUOXETINE HCL 40 MG PO CAPS: 80.0000 mg | ORAL_CAPSULE | Freq: Every day | ORAL | 3 refills | Status: DC

## 2022-06-11 MED ORDER — AMPHETAMINE-DEXTROAMPHETAMINE 30 MG PO TABS: 30.0000 mg | ORAL_TABLET | Freq: Two times a day (BID) | ORAL | 0 refills | Status: DC

## 2022-06-11 NOTE — Progress Notes (Signed)
Virtual Visit via Video Note  I connected with Jason [name] on 06/11/22 at  9:00 AM EDT by a video enabled telemedicine application and verified that I am speaking with the correct person using two identifiers.  Location: Patient: Home Provider: [facility] [place] office    I discussed the limitations of evaluation and management by telemedicine and the availability of in person appointments. The patient expressed understanding and agreed to proceed.   I provided 55 minutes of non-face-to-face time during this encounter.   Adah Salvage, LCSW    Comprehensive Clinical Assessment (CCA) Note  06/11/2022 Jason [name] [id number]  Chief Complaint:  Chief Complaint  Patient presents with   Depression   Visit Diagnosis: Recurrent moderate major depressive disorder with anxiety     CCA Biopsychosocial  Intake/Chief Complaint:  "I am dealing with depression, I have no energy/no drive, I am constantly feeling down, diminshed interest in activities, I feel overwhelmed because I have so many things to do  and don't know where to start"   Current Symptoms/Problems: poor motivation feeling down, loss of interest  Patient Reported Schizophrenia/Schizoaffective Diagnosis in Past: No   Strengths: Art, musician, painting, remodeling, likes to learn, geniune, honest  Preferences: Writing music , drawing, and painting  Abilities: Art, musician, painting, remodeling, likes to learn, can work on cars, geniune, honest   Type of Services Patient Feels are Needed: Medication management, and trying outpatient therapy    Initial Clinical Notes/Concerns: Pt is referred for services by psychiatrist Dr. Tenny Craw due to pt experiencing symptoms of anxiety and depression. He has had no psychiatric hospitalizations. He has participated in outpatient therapy in this office briefly with Lesia Hausen. He saw Suzan Garibaldi for an assessment about a year ago. Symptoms started  around age 50 or earlier, felt anxious, depressed does remember at age 52 experiencing depersonalization one time when he didn't feel like he was in his body, not feeling like I myself",   Mental Health Symptoms Depression:   Change in energy/activity; Fatigue; Hopelessness; Increase/decrease in appetite; Irritability   Duration of Depressive symptoms: No data recorded  Mania:   Change in energy/activity; Irritability   Anxiety:    Worrying; Tension; Irritability; Fatigue   Psychosis:   None   Duration of Psychotic symptoms: No data recorded  Trauma:   None   Obsessions:   None   Compulsions:   None   Inattention:   Poor follow-through on tasks; Symptoms before age 33   Hyperactivity/Impulsivity:   None   Oppositional/Defiant Behaviors:   N/A   Emotional Irregularity:   N/A   Other Mood/Personality Symptoms:   None     Mental Status Exam Appearance and self-care  Stature:   Average   Weight:   Average weight   Clothing:   Casual   Grooming:   Normal   Cosmetic use:   None   Posture/gait:   Normal   Motor activity:   Not Remarkable   Sensorium  Attention:   Normal   Concentration:   Normal   Orientation:   X5   Recall/memory:   Defective in Recent   Affect and Mood  Affect:   Anxious   Mood:   Anxious; Depressed   Relating  Eye contact:   Normal   Facial expression:   Responsive   Attitude toward examiner:   Cooperative   Thought and Language  Speech flow:  Normal   Thought content:   Appropriate to Mood and Circumstances  Preoccupation:   Ruminations (None)   Hallucinations:   None (None)   Organization:  No data recorded  [employer] of Knowledge:   Average   Intelligence:   Average   Abstraction:   Normal   Judgement:   Normal   Reality Testing:   Adequate   Insight:   Good   Decision Making:   Normal   Social Functioning  Social Maturity:   Responsible   Social  Judgement:   Normal   Stress  Stressors:   Work; Housing (Remodeling my home, 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- don't know where to start, have older family members who need help, hour commute to work)   Coping Ability:   Human resources officer Deficits:   None   Supports:   Family; Friends/Service system (Wife and Mother, mother-in-lae)     Religion: Religion/Spirituality Are You A Religious Person?: Yes What is Your Religious Affiliation?: Christian How Might This Affect Treatment?: NA  Leisure/Recreation: Leisure / Recreation Do You Have Hobbies?: Yes Leisure and Hobbies: music (plays guitar and sing), drawing, DIY projects  Exercise/Diet: Exercise/Diet Do You Exercise?: No Have You Gained or Lost A Significant Amount of Weight in the Past Six Months?: No Do You Follow a Special Diet?: No Do You Have Any Trouble Sleeping?: No   CCA Employment/Education Employment/Work Situation: Employment / Work Situation Employment Situation: Employed 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?: No  Education: Education Last Grade Completed: 12 Name of High School: [employer]  Did Garment/textile technologist From [employer]?: Yes Did Theme park manager?: Yes (Attended a year of college) Did You Attend Graduate School?: No Did You Have Any Special Interests In School?: Art Did You Have An Individualized Education Program (IIEP): No Did You Have Any Difficulty At School?: No Were Any Medications Ever Prescribed For These Difficulties?: No Patient's Education Has Been Impacted by Current Illness:  No   CCA Family/Childhood History Family and Relationship History: Family history Marital status: Married (Pt and his wife reside in [place], [place].) Number of Years Married: 15 What types of issues is patient dealing with in the relationship?: get along really well with wife Are you sexually active?: Yes What is your sexual orientation?: Heterosexual Has your sexual activity been affected by drugs, alcohol, medication, or emotional stress?: Emotional stress  Does patient have children?: No  Childhood History:  Childhood History By whom was/is the patient raised?: Both parents Additional childhood history information: Grew up in lower income home in [place], [place], Parents divorced when patient was 33 Description of patient's relationship with caregiver when they were a child: Great  relationship with parents Patient's description of current relationship with people who raised him/her: still great How were you disciplined when you got in trouble as a child/adolescent?: Occasional spankings, grounding  Does patient have siblings?: Yes Number of Siblings: 1 (sister who has special needs) Description of patient's current relationship with siblings: great Did patient suffer any verbal/emotional/physical/sexual abuse as a child?: No Did patient suffer from severe childhood neglect?: No Has patient ever been sexually abused/assaulted/raped as an adolescent or adult?:  No Was the patient ever a victim of a crime or a disaster?: No Witnessed domestic violence?: No Has patient been affected by domestic violence as an adult?: No  Child/Adolescent Assessment: N/A     CCA Substance Use Alcohol/Drug Use: Alcohol / Drug Use Pain Medications: See patient record Prescriptions: See patient record Over the Counter: Vitamin D, Vitamin C, Krill oil History of alcohol / drug use?: No history of alcohol / drug abuse (Drinks on occasion) Longest period of sobriety (when/how long): NA   ASAM's:  Six  Dimensions of Multidimensional Assessment  Dimension 1:  Acute Intoxication and/or Withdrawal Potential:   Dimension 1:  Description of individual's past and current experiences of substance use and withdrawal: none  Dimension 2:  Biomedical Conditions and Complications:   Dimension 2:  Description of patient's biomedical conditions and  complications: none  Dimension 3:  Emotional, Behavioral, or Cognitive Conditions and Complications:  Dimension 3:  Description of emotional, behavioral, or cognitive conditions and complications: none  Dimension 4:  Readiness to Change:  Dimension 4:  Description of Readiness to Change criteria: none  Dimension 5:  Relapse, Continued use, or Continued Problem Potential:  Dimension 5:  Relapse, continued use, or continued problem potential critiera description: none  Dimension 6:  Recovery/Living Environment:  Dimension 6:  Recovery/Iiving environment criteria description: none  ASAM Severity Score: ASAM's Severity Rating Score: 0  ASAM Recommended Level of Treatment:     Substance use Disorder (SUD) None  Recommendations for Services/Supports/Treatments: Recommendations for Services/Supports/Treatments Recommendations For Services/Supports/Treatments: Individual Therapy, Medication Management/patient attends the assessment appointment today.  Confidentiality and limits were discussed.  Nutritional assessment, pain assessment, PHQ 2 and 9 with C-SSRS, GAD-7 administered.  Individual therapy is recommended 1 time every 1 to 4 weeks to alleviate symptoms of depression and improve coping skills to manage stress and anxiety.  Patient agrees to return for an appointment in 1 to 2 weeks.  He also agrees to call this practice, call 911, or have someone take him to the ED should symptoms worsen.  Patient will continue to see psychiatrist Dr. Harrington Challenger for medication management.  DSM5 Diagnoses: Patient Active Problem List   Diagnosis Date Noted   Generalized anxiety  disorder 12/07/2015    Patient Centered Plan: Patient is on the following Treatment Plan(s): Will be developed next session   Referrals to Alternative Service(s): Referred to Alternative Service(s):   Place:   Date:   Time:    Referred to Alternative Service(s):   Place:   Date:   Time:    Referred to Alternative Service(s):   Place:   Date:   Time:    Referred to Alternative Service(s):   Place:   Date:   Time:      Collaboration of Care: Psychiatrist AEB patient sees psychiatrist Dr. Harrington Challenger in this practice for medication management.  Patient/Guardian was advised Release of Information must be obtained prior to any record release in order to collaborate their care with an outside provider. Patient/Guardian was advised if they have not already done so to contact the registration department to sign all necessary forms in order for [place] to release information regarding their care.   Consent: Patient/Guardian gives verbal consent for treatment and assignment of benefits for services provided during this visit. Patient/Guardian expressed understanding and agreed to proceed.   Scottie Stanish E Gaelan Glennon, LCSW

## 2022-06-11 NOTE — Progress Notes (Signed)
Virtual Visit via Telephone Note  I connected with Jason [name] on 06/11/22 at 11:20 AM EDT by telephone and verified that I am speaking with the correct person using two identifiers.  Location: Patient: home Provider: office   I discussed the limitations, risks, security and privacy concerns of performing an evaluation and management service by telephone and the availability of in person appointments. I also discussed with the patient that there may be a patient responsible charge related to this service. The patient expressed understanding and agreed to proceed.    I discussed the assessment and treatment plan with the patient. The patient was provided an opportunity to ask questions and all were answered. The patient agreed with the plan and demonstrated an understanding of the instructions.   The patient was advised to call back or seek an in-person evaluation if the symptoms worsen or if the condition fails to improve as anticipated.  I provided 15 minutes of non-face-to-face time during this encounter.   Levonne Spiller, MD  [facility] MD/PA/NP OP Progress Note  06/11/2022 11:41 AM Jason [name]  MRN:  [id number]  Chief Complaint:  Chief Complaint  Patient presents with   Depression   Anxiety   Follow-up   HPI: This patient is a 41 year old married white male who lives with his wife in [place].  They have no children.  He works in a Proofreader.  The patient returns for follow-up after 6 weeks regarding his depression anxiety and ADD.  He states that he is really not doing that much better.  He still feels quite depressed and anxious.  He worries a lot about family members are getting older and more infirm.  He has little motivation to do things he enjoys.  We did increase Prozac to 80 mg but he has not seen a lot of difference.  He does not think the Lamictal is helping and actually feels like it is making him worse, more blunted and anxious.  We developed a schedule to  slowly get him off it over the next 2 weeks.  He has started working with therapist Maurice Small today.  He denies any thoughts of self-harm or suicide. Visit Diagnosis:    ICD-10-CM   1. Recurrent moderate major depressive disorder with anxiety (HCC)  F33.1    F41.9     2. Attention deficit hyperactivity disorder (ADHD), predominantly inattentive type  F90.0     3. Generalized anxiety disorder  F41.1       Past Psychiatric History: none  Past Medical History:  Past Medical History:  Diagnosis Date   Anxiety disorder    Elevated cholesterol    Hypertension 06/11/2017   Major depressive disorder    History reviewed. No pertinent surgical history.  Family Psychiatric History: See below  Family History:  Family History  Problem Relation Age of Onset   Hypertension Mother    Hyperlipidemia Mother    Hypertension Father    Heart attack Paternal Grandfather     Social History:  Social History   Socioeconomic History   Marital status: Married    Spouse name: Not on file   Number of children: Not on file   Years of education: Not on file   Highest education level: Not on file  Occupational History   Not on file  Tobacco Use   Smoking status: Former   Smokeless tobacco: Never   Tobacco comments:    Quit 06-2008  Substance and Sexual Activity   Alcohol use:  Yes    Comment: occasionally drinks beer, 4 at the most   Drug use: No   Sexual activity: Yes    Birth control/protection: Condom  Other Topics Concern   Not on file  Social History Narrative   Not on file   Social Determinants of Health   Financial Resource Strain: Not on file  Food Insecurity: Not on file  Transportation Needs: Not on file  Physical Activity: Not on file  Stress: Not on file  Social Connections: Not on file    Allergies: No Known Allergies  Metabolic Disorder Labs: No results found for: "HGBA1C", "MPG" No results found for: "PROLACTIN" No results found for: "CHOL", "TRIG", "HDL",  "CHOLHDL", "VLDL", "LDLCALC" No results found for: "TSH"  Therapeutic Level Labs: No results found for: "LITHIUM" No results found for: "VALPROATE" No results found for: "CBMZ"  Current Medications: Current Outpatient Medications  Medication Sig Dispense Refill   acyclovir (ZOVIRAX) 200 MG capsule Take 200 mg by mouth 2 (two) times daily. Reported on 01/05/2016  3   ALPRAZolam (XANAX) 0.5 MG tablet Take 1 tablet (0.5 mg total) by mouth 3 (three) times daily as needed. for anxiety 90 tablet 2   amphetamine-dextroamphetamine (ADDERALL) 30 MG tablet Take 1 tablet by mouth 2 (two) times daily. 60 tablet 0   amphetamine-dextroamphetamine (ADDERALL) 30 MG tablet Take 1 tablet by mouth 2 (two) times daily. 60 tablet 0   FLUoxetine (PROZAC) 40 MG capsule Take 2 capsules (80 mg total) by mouth daily. 60 capsule 3   losartan (COZAAR) 25 MG tablet Take 1 tablet (25 mg total) by mouth daily. 30 tablet 6   No current facility-administered medications for this visit.     Musculoskeletal: Strength & Muscle Tone: na Gait & Station: na Patient leans: N/A  Psychiatric Specialty Exam: Review of Systems  Psychiatric/Behavioral:  Positive for dysphoric mood. The patient is nervous/anxious.   All other systems reviewed and are negative.   There were no vitals taken for this visit.There is no height or weight on file to calculate BMI.  General Appearance: NA  Eye Contact:  NA  Speech:  Clear and Coherent  Volume:  Normal  Mood:  Anxious and Dysphoric  Affect:  NA  Thought Process:  Goal Directed  Orientation:  Full (Time, Place, and Person)  Thought Content: Rumination   Suicidal Thoughts:  No  Homicidal Thoughts:  No  Memory:  Immediate;   Good Recent;   Good Remote;   Good  Judgement:  Good  Insight:  Fair  Psychomotor Activity:  Decreased  Concentration:  Concentration: Good and Attention Span: Good  Recall:  Good  Fund of Knowledge: Good  Language: Good  Akathisia:  No  Handed:   Right  AIMS (if indicated): not done  Assets:  Communication Skills Desire for Improvement Physical Health Resilience Social Support Talents/Skills  ADL's:  Intact  Cognition: WNL  Sleep:  Good   Screenings: GAD-7    Flowsheet Row Counselor from 06/11/2022 in [place] Office Visit from 01/29/2022 in [place] from 05/22/2021 in [facility]  Total GAD-7 Score 12 3 12       PHQ2-9    Flowsheet Row Video Visit from 06/11/2022 in [place] Most recent reading at 06/11/2022 11:32 AM Counselor from 06/11/2022 in [place] Most recent reading at 06/11/2022  9:14 AM Video Visit from 05/01/2022 in [place] [facility] Most  recent reading at 05/01/2022 11:07 AM Office Visit from 01/29/2022 in [place] Most recent reading at 01/29/2022 10:09 AM Video Visit from 11/14/2021 in [place] Most recent reading at 11/14/2021  9:12 AM  PHQ-2 Total Score 5 5 5  0 0  PHQ-9 Total Score 7 11 6 3  --      Flowsheet Row Video Visit from 06/11/2022 in [place] Most recent reading at 06/11/2022 11:32 AM Counselor from 06/11/2022 in [place] Most recent reading at 06/11/2022  9:14 AM Video Visit from 05/01/2022 in [place] Most recent reading at 05/01/2022 11:11 AM  C-SSRS RISK CATEGORY No Risk No Risk No Risk        Assessment and Plan: This patient is a 41 year old male with a history of depression anxiety ADD and mood swings.  He still feels somewhat dysphoric and anxious.  He will taper off Lamictal over the next 2 weeks.  He  will continue Prozac 80 mg daily for depression, Xanax 0.5 mg 3 times daily for anxiety and Adderall 30 mg twice daily for ADD.  He will return to see me in 3 weeks and will think about adding an augmentation agent.  Collaboration of Care: Collaboration of Care: Referral or follow-up with counselor/therapist AEB patient will continue therapy with Maurice Small in our office  Patient/Guardian was advised Release of Information must be obtained prior to any record release in order to collaborate their care with an outside provider. Patient/Guardian was advised if they have not already done so to contact the registration department to sign all necessary forms in order for [place] to release information regarding their care.   Consent: Patient/Guardian gives verbal consent for treatment and assignment of benefits for services provided during this visit. Patient/Guardian expressed understanding and agreed to proceed.    Levonne Spiller, MD 06/11/2022, 11:41 AM

## 2022-07-31 ENCOUNTER — Other Ambulatory Visit (HOSPITAL_COMMUNITY): Payer: Self-pay | Admitting: Psychiatry

## 2022-08-06 ENCOUNTER — Other Ambulatory Visit (HOSPITAL_COMMUNITY): Payer: Self-pay | Admitting: Psychiatry

## 2022-08-06 ENCOUNTER — Encounter (HOSPITAL_COMMUNITY): Payer: Self-pay | Admitting: Psychiatry

## 2022-08-06 ENCOUNTER — Telehealth (HOSPITAL_COMMUNITY): Payer: 59 | Admitting: Psychiatry

## 2022-08-06 DIAGNOSIS — F331 Major depressive disorder, recurrent, moderate: Secondary | ICD-10-CM | POA: Diagnosis not present

## 2022-08-06 DIAGNOSIS — F900 Attention-deficit hyperactivity disorder, predominantly inattentive type: Secondary | ICD-10-CM

## 2022-08-06 DIAGNOSIS — F419 Anxiety disorder, unspecified: Secondary | ICD-10-CM

## 2022-08-06 DIAGNOSIS — F411 Generalized anxiety disorder: Secondary | ICD-10-CM | POA: Diagnosis not present

## 2022-08-06 MED ORDER — FLUOXETINE HCL 40 MG PO CAPS: 80.0000 mg | ORAL_CAPSULE | Freq: Every day | ORAL | 3 refills | Status: DC

## 2022-08-06 MED ORDER — ALPRAZOLAM 0.5 MG PO TABS: 0.5000 mg | ORAL_TABLET | Freq: Three times a day (TID) | ORAL | 2 refills | Status: DC | PRN

## 2022-08-06 MED ORDER — AMPHETAMINE-DEXTROAMPHETAMINE 30 MG PO TABS: 1.0000 | ORAL_TABLET | Freq: Two times a day (BID) | ORAL | 0 refills | Status: DC

## 2022-08-06 MED ORDER — ARIPIPRAZOLE 2 MG PO TABS: 2.0000 mg | ORAL_TABLET | Freq: Every day | ORAL | 2 refills | Status: DC

## 2022-08-06 MED ORDER — AMPHETAMINE-DEXTROAMPHETAMINE 30 MG PO TABS: 30.0000 mg | ORAL_TABLET | Freq: Two times a day (BID) | ORAL | 0 refills | Status: DC

## 2022-08-06 NOTE — Progress Notes (Signed)
Virtual Visit via Telephone Note  I connected with Jason [name] on 08/06/22 at  9:20 AM EST by telephone and verified that I am speaking with the correct person using two identifiers.  Location: Patient: home Provider: office   I discussed the limitations, risks, security and privacy concerns of performing an evaluation and management service by telephone and the availability of in person appointments. I also discussed with the patient that there may be a patient responsible charge related to this service. The patient expressed understanding and agreed to proceed.     I discussed the assessment and treatment plan with the patient. The patient was provided an opportunity to ask questions and all were answered. The patient agreed with the plan and demonstrated an understanding of the instructions.   The patient was advised to call back or seek an in-person evaluation if the symptoms worsen or if the condition fails to improve as anticipated.  I provided 15 minutes of non-face-to-face time during this encounter.   [name], MD  [facility] MD/PA/NP OP Progress Note  08/06/2022 9:28 AM Jason [name]  MRN:  [id number]  Chief Complaint:  Chief Complaint  Patient presents with   Depression   Anxiety   Follow-up   HPI: This patient is a 41 year old married white male who lives with his wife in [place].  They have no children.  He works in a [employer].  The patient returns for follow-up after 2 months regarding his depression anxiety and ADD.  He states since we tapered off the Lamictal he is feeling somewhat better.  He has more energy and feels more alert and awake.  He is doing everything he needs to do and is enjoying playing music with his friends.  However he has a low-grade depression that never seems to go away.  He is not suicidal he is sleeping and eating well and his energy is fairly good.  He asked about an augmentation agent and I suggest a low-dose of Abilify.  I  did warn him about watching his blood sugar as he has had some elevations in the past.  He was also warned about akathisia. Visit Diagnosis:    ICD-10-CM   1. Recurrent moderate major depressive disorder with anxiety (HCC)  F33.1    F41.9     2. Attention deficit hyperactivity disorder (ADHD), predominantly inattentive type  F90.0     3. Generalized anxiety disorder  F41.1       Past Psychiatric History: none  Past Medical History:  Past Medical History:  Diagnosis Date   Anxiety disorder    Elevated cholesterol    Hypertension 06/11/2017   Major depressive disorder    History reviewed. No pertinent surgical history.  Family Psychiatric History: See below  Family History:  Family History  Problem Relation Age of Onset   Hypertension Mother    Hyperlipidemia Mother    Hypertension Father    Heart attack Paternal Grandfather     Social History:  Social History   Socioeconomic History   Marital status: Married    Spouse name: Not on file   Number of children: Not on file   Years of education: Not on file   Highest education level: Not on file  Occupational History   Not on file  Tobacco Use   Smoking status: Former   Smokeless tobacco: Never   Tobacco comments:    Quit 06-2008  Substance and Sexual Activity   Alcohol use: Yes  Comment: occasionally drinks beer, 4 at the most   Drug use: No   Sexual activity: Yes    Birth control/protection: Condom  Other Topics Concern   Not on file  Social History Narrative   Not on file   Social Determinants of Health   Financial Resource Strain: Not on file  Food Insecurity: Not on file  Transportation Needs: Not on file  Physical Activity: Not on file  Stress: Not on file  Social Connections: Not on file    Allergies: No Known Allergies  Metabolic Disorder Labs: No results found for: "HGBA1C", "MPG" No results found for: "PROLACTIN" No results found for: "CHOL", "TRIG", "HDL", "CHOLHDL", "VLDL",  "LDLCALC" No results found for: "TSH"  Therapeutic Level Labs: No results found for: "LITHIUM" No results found for: "VALPROATE" No results found for: "CBMZ"  Current Medications: Current Outpatient Medications  Medication Sig Dispense Refill   acyclovir (ZOVIRAX) 200 MG capsule Take 200 mg by mouth 2 (two) times daily. Reported on 01/05/2016  3   ALPRAZolam (XANAX) 0.5 MG tablet Take 1 tablet (0.5 mg total) by mouth 3 (three) times daily as needed. for anxiety 90 tablet 2   amphetamine-dextroamphetamine (ADDERALL) 30 MG tablet Take 1 tablet by mouth 2 (two) times daily. 60 tablet 0   amphetamine-dextroamphetamine (ADDERALL) 30 MG tablet Take 1 tablet by mouth 2 (two) times daily. 60 tablet 0   ARIPiprazole (ABILIFY) 2 MG tablet Take 1 tablet (2 mg total) by mouth daily. 30 tablet 2   FLUoxetine (PROZAC) 40 MG capsule Take 2 capsules (80 mg total) by mouth daily. 60 capsule 3   losartan (COZAAR) 25 MG tablet Take 1 tablet (25 mg total) by mouth daily. 30 tablet 6   No current facility-administered medications for this visit.     Musculoskeletal: Strength & Muscle Tone: na Gait & Station: na Patient leans: N/A  Psychiatric Specialty Exam: Review of Systems  Psychiatric/Behavioral:  Positive for dysphoric mood.   All other systems reviewed and are negative.   There were no vitals taken for this visit.There is no height or weight on file to calculate BMI.  General Appearance: NA  Eye Contact:  NA  Speech:  Clear and Coherent  Volume:  Normal  Mood:  Dysphoric  Affect:  NA  Thought Process:  Goal Directed  Orientation:  Full (Time, Place, and Person)  Thought Content: Rumination   Suicidal Thoughts:  No  Homicidal Thoughts:  No  Memory:  Immediate;   Good Recent;   Good Remote;   Good  Judgement:  Good  Insight:  Fair  Psychomotor Activity:  Normal  Concentration:  Concentration: Good and Attention Span: Good  Recall:  Good  Fund of Knowledge: Good  Language: Good   Akathisia:  No  Handed:  Right  AIMS (if indicated): not done  Assets:  Communication Skills Desire for Improvement Physical Health Resilience Social Support Talents/Skills  ADL's:  Intact  Cognition: WNL  Sleep:  Good   Screenings: GAD-7    Flowsheet Row [facility] Office Visit from 01/29/2022 [facility] Counselor from 05/22/2021 in [facility]  Total GAD-7 Score 12 3 12       PHQ2-9    Flowsheet Row Video Visit from 08/06/2022 [facility] Most recent reading at 08/06/2022  9:21 AM Video Visit [facility] 06[facility]11[facility]22 [facility] Most recent reading at 06/11/2022 11:32 AM Counselor  from 06/11/2022 [facility] Most recent reading at 06/11/2022  9:14 AM Video Visit from 05/01/2022 [facility] Most recent reading at 05/01/2022 11:07 AM Office Visit from 01/29/2022 in [facility] Most recent reading at 01/29/2022 10:09 AM  PHQ-2 Total Score 1 5 5 5  0  PHQ-9 Total Score 1 7 11 6 3       Flowsheet Row Video Visit from 08/06/2022 in [facility] Most recent reading at 08/06/2022  9:21 AM Video Visit from 06/11/2022 in [facility] Most recent reading at 06/11/2022 11:32 AM Counselor from 06/11/2022 in [facility] Most recent reading at 06/11/2022  9:14 AM  C-SSRS RISK CATEGORY No Risk No Risk No Risk        Assessment and Plan: This patient is a 41 year old male with a history of depression anxiety ADD and mood swings.  He is still somewhat dysphoric although his energy and anxiety have  improved.  We will add Abilify 2 mg daily to his regimen for augmentation.  He will continue Prozac 80 mg daily for depression, Xanax 0.5 mg 3 times daily for anxiety and Adderall 30 mg twice daily for ADD.  He will return to see me in 6 weeks  Collaboration of Care: Collaboration of Care: Referral or follow-up with counselor/therapist AEB patient will continue therapy with 06/13/2022 in our office  Patient/Guardian was advised Release of Information must be obtained prior to any record release in order to collaborate their care with an outside provider. Patient/Guardian was advised if they have not already done so to contact the registration department to sign all necessary forms in order for 24 to release information regarding their care.   Consent: Patient/Guardian gives verbal consent for treatment and assignment of benefits for services provided during this visit. Patient/Guardian expressed understanding and agreed to proceed.    Florencia Reasons, MD 08/06/2022, 9:28 AM

## 2022-08-08 ENCOUNTER — Other Ambulatory Visit (HOSPITAL_COMMUNITY): Payer: Self-pay | Admitting: Psychiatry

## 2022-09-10 ENCOUNTER — Encounter (HOSPITAL_COMMUNITY): Payer: Self-pay

## 2022-09-10 ENCOUNTER — Ambulatory Visit (INDEPENDENT_AMBULATORY_CARE_PROVIDER_SITE_OTHER): Payer: 59 | Admitting: Psychiatry

## 2022-09-10 DIAGNOSIS — F419 Anxiety disorder, unspecified: Secondary | ICD-10-CM

## 2022-09-10 DIAGNOSIS — F331 Major depressive disorder, recurrent, moderate: Secondary | ICD-10-CM | POA: Diagnosis not present

## 2022-09-10 NOTE — Progress Notes (Signed)
Virtual Visit via Video Note  I connected with Jason [name] on 09/10/22 at 1:05 PM EST  by a video enabled telemedicine application and verified that I am speaking with the correct person using two identifiers.  Location: Patient: Home Provider: [place] office    I discussed the limitations of evaluation and management by telemedicine and the availability of in person appointments. The patient expressed understanding and agreed to proceed.   I provided 55 minutes of non-face-to-face time during this encounter.   Alonza Smoker, LCSW    THERAPIST PROGRESS NOTE  Session Time:Monday 09/10/2022 1:05 PM - 2:00 PM   Participation Level: Active  Behavioral Response: CasualAlertAnxious and Depressed  Type of Therapy: Individual Therapy  Treatment Goals addressed: :Improve ability to manage stress and anxiety AEB pt reducing irritability from daily to weekly for 30 day Pt will learn and implement relaxation techniques daily   ProgressTowards Goals: Initial  Interventions: CBT and Supportive  Summary: Jason [name] is a 42 y.o. male who is referred for services by psychiatrist Dr. Harrington Challenger due to pt experiencing symptoms of anxiety and depression. He has had no psychiatric hospitalizations. He has participated in outpatient therapy in this office briefly with Damien Fusi. He saw Maye Hides for an assessment about a year ago. Symptoms started around age 68 or earlier, felt anxious, depressed does remember at age 77 experiencing depersonalization one time when he didn't feel like he was in his body, not feeling like myself".  Patient states having no energy/no drive.  He also states constantly feeling down and reports diminished interest in activities.  He states feeling overwhelmed because he has submitted things to do and does not know where to start.  Symptoms include fatigue, decreased appetite, irritability, thoughts and feelings of hopelessness, and  worrying.   Patient last was seen for the assessment appointment via virtual visit about 2 months ago.  Per his report, recent adjustment in his medication as instructed by psychiatrist Dr. Harrington Challenger has been helpful.  He continues to experience symptoms of anxiety depression as reflected in the PHQ 2 and 9 and the GAD-7.  Patient reports continued poor motivation and feeling overwhelmed.  He reports stress regarding finishing work on his home he and his wife inherited from her grandparents.  Per his report, he is doing all the work himself.  He also worries about the health of his wife who has rheumatoid arthritis.  He also worries about his elderly parents as well as his wife's elderly parents and grandparents.  He states he is the go to person.  He states wanting to have more time for relaxation and doing things he enjoys but has difficulty doing this as he has constant thoughts of he should be doing other things.  He reports feelings of guilt.  He reports fatigue and irritability.  Suicidal/Homicidal: Nowithout intent/plan  Therapist Response: Reviewed symptoms, administered PHQ 2 and 9 and GAD-7, discussed results, discussed stressors, facilitated patient expressing thoughts and feelings, validated feelings, developed treatment plan, obtained patient's permission to electronically signed plan for patient as this was a virtual visit, began to orient patient to CBT, provided psychoeducation on anxiety and stress response, discussed rationale for and assisted patient practice deep breathing to trigger relaxation response, developed plan with patient to practice deep breathing 5 minutes 1 time per day, will send patient handout on deep breathing via mail  Plan: Return again in 2 weeks.  Diagnosis: Recurrent moderate major depressive disorder with anxiety (  [place])  Collaboration of Care: Psychiatrist AEB sees psychiatrist Dr. Harrington Challenger for medication management in this practice  Patient/Guardian was advised  Release of Information must be obtained prior to any record release in order to collaborate their care with an outside provider. Patient/Guardian was advised if they have not already done so to contact the registration department to sign all necessary forms in order for [place] to release information regarding their care.   Consent: Patient/Guardian gives verbal consent for treatment and assignment of benefits for services provided during this visit. Patient/Guardian expressed understanding and agreed to proceed.    Active     Anxiety- irritability, easily frustrated, worrying, fatigue     CZY:SAYTKZS ability to manage stress and anxiety AEB pt reducing irritability from daily to weekly for 30 days      Start:  09/10/22    Expected End:  03/11/23         STG:Pt will learn and implement relaxation techniques daily      Start:  09/10/22    Expected End:  03/11/23           Alonza Smoker, LCSW 09/10/2022

## 2022-09-14 ENCOUNTER — Encounter: Payer: Self-pay | Admitting: *Deleted

## 2022-09-17 ENCOUNTER — Ambulatory Visit: Payer: No Typology Code available for payment source | Attending: Internal Medicine | Admitting: Internal Medicine

## 2022-09-17 ENCOUNTER — Encounter: Payer: Self-pay | Admitting: Internal Medicine

## 2022-09-17 VITALS — BP 134/90 | HR 106 | Ht 65.0 in | Wt 156.2 lb

## 2022-09-17 DIAGNOSIS — I10 Essential (primary) hypertension: Secondary | ICD-10-CM | POA: Diagnosis not present

## 2022-09-17 DIAGNOSIS — R519 Headache, unspecified: Secondary | ICD-10-CM | POA: Diagnosis not present

## 2022-09-17 DIAGNOSIS — Z136 Encounter for screening for cardiovascular disorders: Secondary | ICD-10-CM | POA: Diagnosis not present

## 2022-09-17 DIAGNOSIS — R000 Tachycardia, unspecified: Secondary | ICD-10-CM

## 2022-09-17 MED ORDER — CHLORTHALIDONE 25 MG PO TABS: 25.0000 mg | ORAL_TABLET | Freq: Every day | ORAL | 1 refills | Status: DC

## 2022-09-17 NOTE — Progress Notes (Signed)
Cardiology Office Note  Date: 09/17/2022   ID: Estrella Myrtle II, DOB 1981-02-11, MRN [id number]  PCP:  Rory Percy, MD  Cardiologist:  Chalmers Guest, MD Electrophysiologist:  None   Reason for Office Visit: HTN management   History of Present Illness: Estrella Myrtle II is a 42 y.o. male known to have HTN, anxiety, ADHD, depression was referred to cardiology clinic for HTN management.  Accompanied by wife.  Patient reported that he has been on losartan 25 mg once daily for the last 5 years and his blood pressures remain poorly controlled. His home blood pressures were in the range of 130-140s mm Hg SBP. Denies any angina, DOE, palpitations, syncope, leg swelling.  He does feel dizzy with positional changes but no persistent dizziness. He follows up with his PCP, Odessa Fleming and psychiatrist for management of anxiety, ADHD and depression medications. He does have stressors in his life and thinks about it a lot.  He did complain of pain in the left side of his temple region and was worried if it could be a stroke.  His wife added that he has a family history of stroke and that his sister was diagnosed with stroke recently.  Past Medical History:  Diagnosis Date   Anxiety disorder    Elevated cholesterol    Hypertension 06/11/2017   Major depressive disorder     No past surgical history on file.  Current Outpatient Medications  Medication Sig Dispense Refill   acyclovir (ZOVIRAX) 200 MG capsule Take 200 mg by mouth 2 (two) times daily as needed. Reported on 01/05/2016  3   ALPRAZolam (XANAX) 0.25 MG tablet Take 0.25 mg by mouth 3 (three) times daily.     amphetamine-dextroamphetamine (ADDERALL) 30 MG tablet Take 1 tablet by mouth 2 (two) times daily. 60 tablet 0   amphetamine-dextroamphetamine (ADDERALL) 30 MG tablet Take 1 tablet by mouth 2 (two) times daily. 60 tablet 0   chlorthalidone (HYGROTON) 25 MG tablet Take 1 tablet (25 mg total) by mouth daily. 90 tablet  1   FLUoxetine (PROZAC) 40 MG capsule Take 2 capsules (80 mg total) by mouth daily. (Patient taking differently: Take 40 mg by mouth daily.) 60 capsule 3   ARIPiprazole (ABILIFY) 2 MG tablet Take 1 tablet (2 mg total) by mouth daily. (Patient not taking: Reported on 09/17/2022) 30 tablet 2   No current facility-administered medications for this visit.   Allergies:  Patient has no known allergies.   Social History: The patient  reports that he has quit smoking. He has never used smokeless tobacco. He reports current alcohol use. He reports that he does not use drugs.   Family History: The patient's family history includes Heart attack in his paternal grandfather; Hyperlipidemia in his mother; Hypertension in his father and mother.   ROS:  Please see the history of present illness. Otherwise, complete review of systems is positive for none.  All other systems are reviewed and negative.   Physical Exam: VS:  BP (!) 134/90   Pulse (!) 106   Ht 5\' 5"  (1.651 m)   Wt 156 lb 3.2 oz (70.9 kg)   SpO2 98%   BMI 25.99 kg/m , BMI Body mass index is 25.99 kg/m.  Wt Readings from Last 3 Encounters:  09/17/22 156 lb 3.2 oz (70.9 kg)  03/02/19 149 lb (67.6 kg)  01/10/17 145 lb (65.8 kg)    General: Patient appears comfortable at rest. HEENT: Conjunctiva and lids normal, oropharynx clear  with moist mucosa. Neck: Supple, no elevated JVP or carotid bruits, no thyromegaly. Lungs: Clear to auscultation, nonlabored breathing at rest. Cardiac: Regular rate and rhythm, no S3 or significant systolic murmur, no pericardial rub. Abdomen: Soft, nontender, no hepatomegaly, bowel sounds present, no guarding or rebound. Extremities: No pitting edema, distal pulses 2+. Skin: Warm and dry. Musculoskeletal: No kyphosis. Neuropsychiatric: Alert and oriented x3, affect grossly appropriate.  ECG:  An ECG dated 09/17/2022 was personally reviewed today and demonstrated:  Sinus tachycardia  Recent Labwork: No  results found for requested labs within last 365 days.  No results found for: "CHOL", "TRIG", "HDL", "CHOLHDL", "VLDL", "LDLCALC", "LDLDIRECT"  Other Studies Reviewed Today:   Assessment and Plan: Patient is a 42 year old M known to have HTN, ADHD, anxiety, depression was referred to cardiology clinic for management of HTN.  # HTN, poorly controlled -Switch losartan to chlorthalidone 25 mg once daily -PCP referral for management of HTN  # Sinus tachycardia, asymptomatic and likely secondary to anxiety/ADHD/depression -Treat the underlying etiology -Follow-up with PCP (new PCP referral) and psychiatry for medical management  # Left-sided face pain -Left-sided face pain is not a symptom of stroke -Follow-up with PCP for further management  # Screening for cardiovascular condition -Patient is interested to obtain CT calcium scoring test to rule out CAD.  Obtain CT calcium scoring test.  I have spent a total of 45 minutes with patient reviewing chart, EKGs, labs and examining patient as well as establishing an assessment and plan that was discussed with the patient.  > 50% of time was spent in direct patient care.      Medication Adjustments/Labs and Tests Ordered: Current medicines are reviewed at length with the patient today.  Concerns regarding medicines are outlined above.   Tests Ordered: Orders Placed This Encounter  Procedures   CT CARDIAC SCORING   Ambulatory Referral to Primary Care   EKG 12-Lead    Medication Changes: Meds ordered this encounter  Medications   chlorthalidone (HYGROTON) 25 MG tablet    Sig: Take 1 tablet (25 mg total) by mouth daily.    Dispense:  90 tablet    Refill:  1    09/17/2022 NEW-stop losartan    Disposition:  Follow up  as needed  Signed Saiya Crist Fidel Levy, MD, 09/17/2022 11:44 AM    [facility], [place], [place] [id number]

## 2022-09-17 NOTE — Patient Instructions (Signed)
Medication Instructions:  Your physician has recommended you make the following change in your medication:  Stop losartan Start chlorthalidone 25 mg daily Continue other medications the same  Labwork: none  Testing/Procedures: Calcium Score CT  Follow-Up: Your physician recommends that you schedule a follow-up appointment in: as needed  Any Other Special Instructions Will Be Listed Below (If Applicable). You have been referred to Primary Care  If you need a refill on your cardiac medications before your next appointment, please call your pharmacy.

## 2022-10-01 ENCOUNTER — Ambulatory Visit (INDEPENDENT_AMBULATORY_CARE_PROVIDER_SITE_OTHER): Payer: 59 | Admitting: Psychiatry

## 2022-10-01 DIAGNOSIS — F331 Major depressive disorder, recurrent, moderate: Secondary | ICD-10-CM | POA: Diagnosis not present

## 2022-10-01 DIAGNOSIS — F419 Anxiety disorder, unspecified: Secondary | ICD-10-CM | POA: Diagnosis not present

## 2022-10-01 NOTE — Progress Notes (Signed)
Virtual Visit via Video Note  I connected with Jason [name] on 10/01/22 at 11:05 AM EST  by a video enabled telemedicine application and verified that I am speaking with the correct person using two identifiers.  Location: Patient: Home Provider: [place] office    I discussed the limitations of evaluation and management by telemedicine and the availability of in person appointments. The patient expressed understanding and agreed to proceed.   I provided 50 minutes of non-face-to-face time during this encounter.   Alonza Smoker, LCSW   THERAPIST PROGRESS NOTE  Session Time: Monday 08/01/2023 11:05 AM - 11:55 AM   Participation Level: Active  Behavioral Response: CasualAlertAnxious and Depressed  Type of Therapy: Individual Therapy  Treatment Goals addressed: :Improve ability to manage stress and anxiety AEB pt reducing irritability from daily to weekly for 30 day Pt will learn and implement relaxation techniques daily   ProgressTowards Goals: Initial  Interventions: CBT and Supportive  Summary: Jason [name] is a 42 y.o. male who is referred for services by psychiatrist Dr. Harrington Challenger due to pt experiencing symptoms of anxiety and depression. He has had no psychiatric hospitalizations. He has participated in outpatient therapy in this office briefly with Damien Fusi. He saw Maye Hides for an assessment about a year ago. Symptoms started around age 63 or earlier, felt anxious, depressed does remember at age 36 experiencing depersonalization one time when he didn't feel like he was in his body, not feeling like myself".  Patient states having no energy/no drive.  He also states constantly feeling down and reports diminished interest in activities.  He states feeling overwhelmed because he has submitted things to do and does not know where to start.  Symptoms include fatigue, decreased appetite, irritability, thoughts and feelings of hopelessness, and  worrying.   Patient last was seen about 3 weeks ago.  He reports decreased intensity and frequency of symptoms of depression/anxiety as reflected in the PHQ 2 and 9 and the GAD-7.  Patient reports having more self compassion as well as more realistic expectations of self.  He reports he has been trying to avoid pressuring self to try to do too many tasks when he arrives home from work.  He reports also practicing deep breathing.  Per patient's report, he has experienced increased awareness of when he is becoming tense and experiencing worry thoughts.  He has been using deep breathing as a practice as well as an intervention.  Patient reports decreased irritability.  he also has been engaging in more pleasurable activities such as drawing on his lunch break and taking time for his music.  He reports positive self-care regarding sleep, eating patterns, and exercise.  He is pleased he has reduced caffeine consumption.    Suicidal/Homicidal: Nowithout intent/plan  Therapist Response: Reviewed symptoms, administered PHQ 2 and 9 and GAD-7, discussed results, praised and reinforced patient's use of deep breathing as a practice and an intervention, discussed effects of use, praised and reinforced patient's increased self compassion and realistic expectations of self, discussed effects on his thoughts/mood/behavior, praised and reinforced patient's efforts to engage in pleasurable activities, discussed effects, discussed rationale for and assisted patient practice leaves on a stream mindfulness activity to improve mindfulness skills as well as cope with ruminating thoughts, develop plan with patient to practice between sessions, checked out interactive audio activity to patient and provided with access code.   Plan: Return again in 2 weeks.  Diagnosis: Recurrent moderate major depressive disorder with anxiety (  [place])  Collaboration of Care: Psychiatrist AEB sees psychiatrist Dr. Harrington Challenger for medication management in  this practice  Patient/Guardian was advised Release of Information must be obtained prior to any record release in order to collaborate their care with an outside provider. Patient/Guardian was advised if they have not already done so to contact the registration department to sign all necessary forms in order for [place] to release information regarding their care.   Consent: Patient/Guardian gives verbal consent for treatment and assignment of benefits for services provided during this visit. Patient/Guardian expressed understanding and agreed to proceed.       Alonza Smoker, LCSW 10/01/2022

## 2022-10-25 ENCOUNTER — Other Ambulatory Visit (HOSPITAL_COMMUNITY): Payer: Self-pay | Admitting: Psychiatry

## 2022-10-25 ENCOUNTER — Telehealth (HOSPITAL_COMMUNITY): Payer: Self-pay

## 2022-10-25 MED ORDER — AMPHETAMINE-DEXTROAMPHETAMINE 30 MG PO TABS: 30.0000 mg | ORAL_TABLET | Freq: Two times a day (BID) | ORAL | 0 refills | Status: DC

## 2022-10-25 NOTE — Telephone Encounter (Signed)
sent 

## 2022-10-25 NOTE — Telephone Encounter (Signed)
Spoke with pt advised medication has been sent to the pharmacy. Pt verbalized understanding.

## 2022-10-25 NOTE — Telephone Encounter (Signed)
Pt called in to schedule appt. Pt scheduled 11/08/22. States that he needs a refill on his ALPRAZolam (XANAX) 0.25 MG tablet  and his amphetamine-dextroamphetamine (ADDERALL) 30 MG tablet to [facility]. Please advise.

## 2022-11-05 ENCOUNTER — Ambulatory Visit (HOSPITAL_COMMUNITY)
Admission: RE | Admit: 2022-11-05 | Discharge: 2022-11-05 | Disposition: A | Discharge status: Home / Self Care | Payer: No Typology Code available for payment source | Source: Ambulatory Visit | Attending: Internal Medicine | Admitting: Internal Medicine

## 2022-11-05 DIAGNOSIS — I10 Essential (primary) hypertension: Secondary | ICD-10-CM | POA: Insufficient documentation

## 2022-11-08 ENCOUNTER — Telehealth (HOSPITAL_COMMUNITY): Payer: 59 | Admitting: Psychiatry

## 2022-11-28 ENCOUNTER — Other Ambulatory Visit (HOSPITAL_COMMUNITY): Payer: Self-pay | Admitting: Psychiatry

## 2022-11-29 ENCOUNTER — Telehealth (HOSPITAL_COMMUNITY): Payer: Self-pay

## 2022-11-29 MED ORDER — AMPHETAMINE-DEXTROAMPHETAMINE 30 MG PO TABS: 30.0000 mg | ORAL_TABLET | Freq: Two times a day (BID) | ORAL | 0 refills | Status: DC

## 2022-11-29 NOTE — Telephone Encounter (Signed)
Done

## 2022-11-29 NOTE — Telephone Encounter (Signed)
Pt called in needing a refill of his amphetamine-dextroamphetamine (ADDERALL) 30 MG tablet sent in to [facility] last filled 10/25/22 pt scheduled with Dr Harrington Challenger 12/17/22. Please advise

## 2022-11-29 NOTE — Telephone Encounter (Signed)
Called pt no answer left vm to check pharmacy 

## 2022-12-10 ENCOUNTER — Ambulatory Visit (INDEPENDENT_AMBULATORY_CARE_PROVIDER_SITE_OTHER): Payer: 59 | Admitting: Psychiatry

## 2022-12-10 DIAGNOSIS — F331 Major depressive disorder, recurrent, moderate: Secondary | ICD-10-CM

## 2022-12-10 DIAGNOSIS — F419 Anxiety disorder, unspecified: Secondary | ICD-10-CM | POA: Diagnosis not present

## 2022-12-10 NOTE — Progress Notes (Signed)
Virtual Visit via Video Note  I connected with Jason [name] on 12/10/22 at 2:07 PM EDT  by a video enabled telemedicine application and verified that I am speaking with the correct person using two identifiers.  Location: Patient: Home Provider: [facility] [place] office    I discussed the limitations of evaluation and management by telemedicine and the availability of in person appointments. The patient expressed understanding and agreed to proceed.   I provided 41 minutes of non-face-to-face time during this encounter.   Adah Salvage, LCSW   THERAPIST PROGRESS NOTE  Session Time: Monday 12/10/2022 2:07 PM - 2:48 PM   Participation Level: Active  Behavioral Response: CasualAlertAnxious and Depressed  Type of Therapy: Individual Therapy  Treatment Goals addressed: :Improve ability to manage stress and anxiety AEB pt reducing irritability from daily to weekly for 30 day Pt will learn and implement relaxation techniques daily   ProgressTowards Goals: Initial  Interventions: CBT and Supportive  Summary: Jason [name] is a 42 y.o. male who is referred for services by psychiatrist Dr. Tenny Craw due to pt experiencing symptoms of anxiety and depression. He has had no psychiatric hospitalizations. He has participated in outpatient therapy in this office briefly with Lesia Hausen. He saw Suzan Garibaldi for an assessment about a year ago. Symptoms started around age 71 or earlier, felt anxious, depressed does remember at age 11 experiencing depersonalization one time when he didn't feel like he was in his body, not feeling like myself".  Patient states having no energy/no drive.  He also states constantly feeling down and reports diminished interest in activities.  He states feeling overwhelmed because he has submitted things to do and does not know where to start.  Symptoms include fatigue, decreased appetite, irritability, thoughts and feelings of hopelessness, and  worrying.   Patient last was seen about 2 months ago.  He reports decreased intensity and frequency of symptoms of depression but increased symptoms /anxiety as reflected in the PHQ 2 and 9 and the GAD-7.   He reports feeling overwhelmed and experiencing increased irritability as he is not getting things done.  Per his report, he reports feeling stressed when wife asked him about doing needed repairs renovation to their home.  Patient states being very tired when he gets home from work and not feeling like working on these projects.  He expresses resentment and anger.  He reports enjoying drawing and his music but states he is probably spending too much time doing these activities.  He reports positive self-care regarding sleep, eating patterns, and exercise but also says he probably is consuming too much caffeine.  He reports being medication compliant.  Patient reports he has not been practicing any relaxation techniques.   Suicidal/Homicidal: Nowithout intent/plan  Therapist Response: Reviewed symptoms, administered PHQ 2 and 9 and GAD-7, discussed results, discussed stressors, facilitated expression of thoughts and feelings, validated feelings, assisted patient with problem solving regarding determining a realistic schedule for completing household projects, discussed using some of his vacation time to do projects that are time-consuming, also discussed as scans support system for help in completing projects, developed plan with patient to develop list of projects and a timeline, assisted patient identify realistic expectations of self, reviewed rationale for and develop plan with patient to practice deep breathing   Plan: Return again in 2 weeks.  Diagnosis: Recurrent moderate major depressive disorder with anxiety  Collaboration of Care: Psychiatrist AEB sees psychiatrist Dr. Tenny Craw for medication management in this practice  Patient/Guardian was advised Release of Information must be obtained  prior to any record release in order to collaborate their care with an outside provider. Patient/Guardian was advised if they have not already done so to contact the registration department to sign all necessary forms in order for [place] to release information regarding their care.   Consent: Patient/Guardian gives verbal consent for treatment and assignment of benefits for services provided during this visit. Patient/Guardian expressed understanding and agreed to proceed.       Adah Salvage, LCSW 12/10/2022

## 2022-12-13 ENCOUNTER — Telehealth (HOSPITAL_COMMUNITY): Payer: 59 | Admitting: Psychiatry

## 2022-12-17 ENCOUNTER — Telehealth (HOSPITAL_COMMUNITY): Payer: 59 | Admitting: Psychiatry

## 2022-12-24 ENCOUNTER — Ambulatory Visit (HOSPITAL_COMMUNITY): Payer: 59 | Admitting: Psychiatry

## 2022-12-26 ENCOUNTER — Telehealth (HOSPITAL_COMMUNITY): Payer: 59 | Admitting: Psychiatry

## 2022-12-26 ENCOUNTER — Encounter (HOSPITAL_COMMUNITY): Payer: Self-pay | Admitting: Psychiatry

## 2022-12-26 DIAGNOSIS — F900 Attention-deficit hyperactivity disorder, predominantly inattentive type: Secondary | ICD-10-CM

## 2022-12-26 DIAGNOSIS — F331 Major depressive disorder, recurrent, moderate: Secondary | ICD-10-CM | POA: Diagnosis not present

## 2022-12-26 DIAGNOSIS — F411 Generalized anxiety disorder: Secondary | ICD-10-CM

## 2022-12-26 DIAGNOSIS — F419 Anxiety disorder, unspecified: Secondary | ICD-10-CM | POA: Diagnosis not present

## 2022-12-26 MED ORDER — AMPHETAMINE-DEXTROAMPHETAMINE 30 MG PO TABS: 30.0000 mg | ORAL_TABLET | Freq: Two times a day (BID) | ORAL | 0 refills | Status: DC

## 2022-12-26 MED ORDER — FLUOXETINE HCL 20 MG PO CAPS: 20.0000 mg | ORAL_CAPSULE | Freq: Every day | ORAL | 2 refills | Status: DC

## 2022-12-26 MED ORDER — ALPRAZOLAM 0.25 MG PO TABS: 0.2500 mg | ORAL_TABLET | Freq: Three times a day (TID) | ORAL | 2 refills | Status: DC

## 2022-12-26 MED ORDER — AMPHETAMINE-DEXTROAMPHET ER 30 MG PO CP24: 30.0000 mg | ORAL_CAPSULE | Freq: Two times a day (BID) | ORAL | 0 refills | Status: DC

## 2022-12-26 NOTE — Progress Notes (Signed)
Virtual Visit via Video Note  I connected with Jason [name] on 12/26/22 at 10:00 AM EDT by a video enabled telemedicine application and verified that I am speaking with the correct person using two identifiers.  Location: Patient: home Provider: office   I discussed the limitations of evaluation and management by telemedicine and the availability of in person appointments. The patient expressed understanding and agreed to proceed.      I discussed the assessment and treatment plan with the patient. The patient was provided an opportunity to ask questions and all were answered. The patient agreed with the plan and demonstrated an understanding of the instructions.   The patient was advised to call back or seek an in-person evaluation if the symptoms worsen or if the condition fails to improve as anticipated.  I provided 15 minutes of non-face-to-face time during this encounter.   [name], MD  [facility] MD/PA/NP OP Progress Note  12/26/2022 10:13 AM Jason [name]  MRN:  [id number]  Chief Complaint:  Chief Complaint  Patient presents with   Depression   Anxiety   ADD   HPI: This patient is a 42 year old married white male lives with his wife in [place].  They have no children.  He works in a [employer].  The patient returns for follow-up after 5 months regarding his depression anxiety and ADD.  He states that he is generally feeling better and his mood has been stable.  We talked about adding Abilify last time but he never picked it up.  In fact he has cut back his Prozac from 40 to 20 mg and states that he actually feels better.  He is enjoying playing music with friends and also has been creating art work and has gotten into a judged competition.  He is sleeping and eating well and his energy is good.  He denies thoughts of self-harm or suicide Visit Diagnosis:    ICD-10-CM   1. Recurrent moderate major depressive disorder with anxiety (HCC)  F33.1    F41.9     2.  Attention deficit hyperactivity disorder (ADHD), predominantly inattentive type  F90.0     3. Generalized anxiety disorder  F41.1       Past Psychiatric History: none  Past Medical History:  Past Medical History:  Diagnosis Date   Anxiety disorder    Elevated cholesterol    Hypertension 06/11/2017   Major depressive disorder    History reviewed. No pertinent surgical history.  Family Psychiatric History: See below  Family History:  Family History  Problem Relation Age of Onset   Hypertension Mother    Hyperlipidemia Mother    Hypertension Father    Heart attack Paternal Grandfather     Social History:  Social History   Socioeconomic History   Marital status: Married    Spouse name: Not on file   Number of children: Not on file   Years of education: Not on file   Highest education level: Not on file  Occupational History   Not on file  Tobacco Use   Smoking status: Former   Smokeless tobacco: Never   Tobacco comments:    Quit 06-2008  Substance and Sexual Activity   Alcohol use: Yes    Comment: occasionally drinks beer, 4 at the most   Drug use: No   Sexual activity: Yes    Birth control/protection: Condom  Other Topics Concern   Not on file  Social History Narrative   Not on file  Social Determinants of Health   Financial Resource Strain: Not on file  Food Insecurity: Not on file  Transportation Needs: Not on file  Physical Activity: Not on file  Stress: Not on file  Social Connections: Not on file    Allergies: No Known Allergies  Metabolic Disorder Labs: No results found for: "HGBA1C", "MPG" No results found for: "PROLACTIN" No results found for: "CHOL", "TRIG", "HDL", "CHOLHDL", "VLDL", "LDLCALC" No results found for: "TSH"  Therapeutic Level Labs: No results found for: "LITHIUM" No results found for: "VALPROATE" No results found for: "CBMZ"  Current Medications: Current Outpatient Medications  Medication Sig Dispense Refill    amphetamine-dextroamphetamine (ADDERALL XR) 30 MG 24 hr capsule Take 1 capsule (30 mg total) by mouth 2 (two) times daily. 60 capsule 0   amphetamine-dextroamphetamine (ADDERALL) 30 MG tablet Take 1 tablet by mouth 2 (two) times daily. 60 tablet 0   FLUoxetine (PROZAC) 20 MG capsule Take 1 capsule (20 mg total) by mouth daily. 30 capsule 2   acyclovir (ZOVIRAX) 200 MG capsule Take 200 mg by mouth 2 (two) times daily as needed. Reported on 01/05/2016  3   ALPRAZolam (XANAX) 0.25 MG tablet Take 1 tablet (0.25 mg total) by mouth 3 (three) times daily. 30 tablet 2   amphetamine-dextroamphetamine (ADDERALL) 30 MG tablet Take 1 tablet by mouth 2 (two) times daily. 60 tablet 0   chlorthalidone (HYGROTON) 25 MG tablet Take 1 tablet (25 mg total) by mouth daily. 90 tablet 1   No current facility-administered medications for this visit.     Musculoskeletal: Strength & Muscle Tone: within normal limits Gait & Station: normal Patient leans: N/A  Psychiatric Specialty Exam: Review of Systems  All other systems reviewed and are negative.   There were no vitals taken for this visit.There is no height or weight on file to calculate BMI.  General Appearance: Casual and Fairly Groomed  Eye Contact:  Good  Speech:  Clear and Coherent  Volume:  Normal  Mood:  Euthymic  Affect:  Congruent  Thought Process:  Goal Directed  Orientation:  Full (Time, Place, and Person)  Thought Content: WDL   Suicidal Thoughts:  No  Homicidal Thoughts:  No  Memory:  Immediate;   Good Recent;   Good Remote;   Good  Judgement:  Good  Insight:  Good  Psychomotor Activity:  Normal  Concentration:  Concentration: Good and Attention Span: Good  Recall:  Good  Fund of Knowledge: Good  Language: Good  Akathisia:  No  Handed:  Right  AIMS (if indicated): not done  Assets:  Communication Skills Desire for Improvement Physical Health Resilience Social Support Talents/Skills  ADL's:  Intact  Cognition: WNL  Sleep:   Good   Screenings: GAD-7    Advertising copywriter from 12/10/2022 in [facility] at [place] Counselor from 10/01/2022 in [facility] Out[facility] at [place] Counselor from 06/11/2022 in [facility] at [place] Office Visit from 01/29/2022 in [facility] Outpatient [facility] at [place] Counselor from 05/22/2021 in [facility] at [place]  Total GAD-7 Score 20 7 12 3 12       PHQ2-9    Flowsheet Row Counselor from 12/10/2022 in [facility] Out[facility] at [place] Counselor from 10/01/2022 in [facility] Outpatient Behavioral Health at [place] Counselor from 09/10/2022 in [facility] at [place] Video Visit from 08/06/2022 in [facility] Outpatient Behavior[facility] Health at [place] Video Visit from 06/11/2022  in [facility] at [facility] Total Score 2 1 2 1 5   PHQ-9 Total Score 7 4 9 1 7       Flowsheet Row Video Visit from 08/06/2022 in [facility]patient Behavioral Health at [place] Most recent reading at 08/06/2022  9:21 AM Video Visit from 06/11/2022 in [facility] Outpatient Behavioral Health at [place] Most recent reading at 06/11/2022 11:32 AM Counselor from 06/11/2022 in [facility] Outpatient Behavioral Health at [place] Most recent reading at 06/11/2022  9:14 AM  C-SSRS RISK CATEGORY No Risk No Risk No Risk        Assessment and Plan: This patient is a 42 year old male with a history depression anxiety ADD and mood swings.  He has cut back on the Prozac and actually seems to be doing better.  He will continue Prozac 20 mg daily for depression, Xanax 0.25 mg 3 times daily for anxiety and Adderall 30 mg twice daily for ADD.  He will return to see me in 3 months  Collaboration of Care: Collaboration of Care: Referral or follow-up with counselor/therapist AEB patient will  continue therapy with Florencia Reasons in our office  Patient/Guardian was advised Release of Information must be obtained prior to any record release in order to collaborate their care with an outside provider. Patient/Guardian was advised if they have not already done so to contact the registration department to sign all necessary forms in order for [place] to release information regarding their care.   Consent: Patient/Guardian gives verbal consent for treatment and assignment of benefits for services provided during this visit. Patient/Guardian expressed understanding and agreed to proceed.    [name], MD 12/26/2022, 10:13 AM

## 2022-12-27 ENCOUNTER — Telehealth (HOSPITAL_COMMUNITY): Payer: Self-pay | Admitting: *Deleted

## 2022-12-27 ENCOUNTER — Other Ambulatory Visit (HOSPITAL_COMMUNITY): Payer: Self-pay | Admitting: Psychiatry

## 2022-12-27 MED ORDER — ALPRAZOLAM 0.5 MG PO TABS: 0.5000 mg | ORAL_TABLET | ORAL | 2 refills | Status: DC | PRN

## 2022-12-27 NOTE — Telephone Encounter (Signed)
Patient stated that provider sent in the wrong mg for him. Per pt he don't remember discussing with provider during his appt about decreasing his Xanax. Per pt he normally take Xanax 0.5mg  TID but provider sent in 0.25 TID.   Per pt he would like for provider to please send in his normal dose of Xanax 0.5mg  TID to [facility] in [place].

## 2023-01-07 ENCOUNTER — Ambulatory Visit (INDEPENDENT_AMBULATORY_CARE_PROVIDER_SITE_OTHER): Payer: 59 | Admitting: Psychiatry

## 2023-01-07 DIAGNOSIS — F419 Anxiety disorder, unspecified: Secondary | ICD-10-CM | POA: Diagnosis not present

## 2023-01-07 DIAGNOSIS — F331 Major depressive disorder, recurrent, moderate: Secondary | ICD-10-CM | POA: Diagnosis not present

## 2023-01-07 NOTE — Progress Notes (Signed)
Virtual Visit via Video Note  I connected with Jason [name] on 01/07/23 at 11:10 AM EDT  by a video enabled telemedicine application and verified that I am speaking with the correct person using two identifiers.  Location: Patient: Home Provider: [facility] [place] office    I discussed the limitations of evaluation and management by telemedicine and the availability of in person appointments. The patient expressed understanding and agreed to proceed.  anticipated.  I provided 48 minutes of non-face-to-face time during this encounter.   Adah Salvage, LCSW    THERAPIST PROGRESS NOTE  Session Time: Monday 01/07/2023 11:10 AM - 11:58 AM   Participation Level: Active  Behavioral Response: CasualAlertAnxious and Depressed  Type of Therapy: Individual Therapy  Treatment Goals addressed: :Improve ability to manage stress and anxiety AEB pt reducing irritability from daily to weekly for 30 day Pt will learn and implement relaxation techniques daily   ProgressTowards Goals: Progressing  Interventions: CBT and Supportive  Summary: Jason [name] is a 42 y.o. male who is referred for services by psychiatrist Dr. Tenny Craw due to pt experiencing symptoms of anxiety and depression. He has had no psychiatric hospitalizations. He has participated in outpatient therapy in this office briefly with Lesia Hausen. He saw Suzan Garibaldi for an assessment about a year ago. Symptoms started around age 78 or earlier, felt anxious, depressed does remember at age 60 experiencing depersonalization one time when he didn't feel like he was in his body, not feeling like myself".  Patient states having no energy/no drive.  He also states constantly feeling down and reports diminished interest in activities.  He states feeling overwhelmed because he has submitted things to do and does not know where to start.  Symptoms include fatigue, decreased appetite, irritability, thoughts and feelings of  hopelessness, and worrying.   Patient last was seen about 1 month ago.  He reports continue intensity and frequency of symptoms of depression. He reports decreased worry and stress as he recently made a deadline and completed an art project.  However, he expresses frustration he continues to experience irritability frequently.  He expresses frustration now as he reports continued irritability.  He states being tired and not wanting to do things he thinks he should do.  He expresses frustration with himself as he states he should want to do things and not have any negative thoughts about doing things that he does not want to do.  He expresses guilt for wanting to have time for self.  He also expresses guilt about not doing things he thinks he should have done earlier in life.  He reports he has not yet developed timeline for household projects.   Suicidal/Homicidal: Nowithout intent/plan  Therapist Response: Reviewed symptoms, discussed stressors, facilitated expression of thoughts and feelings, validated feelings, discussed possible causes of fatigue, assisted patient identify connection between thoughts/feelings/behaviors, assisted patient began to examine his thought patterns and using should and ought, assisted patient begin to challenge and replace thoughts with more helpful thoughts, assisted patient identify realistic expectations of self, developed plan with patient to use replacement statements , will send patient handouts in the mail in preparation for next session,   Plan: Return again in 2 weeks.  Diagnosis: Recurrent moderate major depressive disorder with anxiety (HCC)  Collaboration of Care: Psychiatrist AEB sees psychiatrist Dr. Tenny Craw for medication management in this practice  Patient/Guardian was advised Release of Information must be obtained prior to any record release in order to collaborate their care  with an outside provider. Patient/Guardian was advised if they have not already  done so to contact the registration department to sign all necessary forms in order for [place] to release information regarding their care.   Consent: Patient/Guardian gives verbal consent for treatment and assignment of benefits for services provided during this visit. Patient/Guardian expressed understanding and agreed to proceed.       Adah Salvage, LCSW 01/07/2023

## 2023-01-23 ENCOUNTER — Other Ambulatory Visit (HOSPITAL_COMMUNITY): Payer: Self-pay | Admitting: Psychiatry

## 2023-01-23 ENCOUNTER — Telehealth (HOSPITAL_COMMUNITY): Payer: Self-pay | Admitting: *Deleted

## 2023-01-23 MED ORDER — ALPRAZOLAM 0.5 MG PO TABS: 0.5000 mg | ORAL_TABLET | ORAL | 2 refills | Status: DC | PRN

## 2023-01-23 MED ORDER — AMPHETAMINE-DEXTROAMPHET ER 30 MG PO CP24: 30.0000 mg | ORAL_CAPSULE | Freq: Two times a day (BID) | ORAL | 0 refills | Status: DC

## 2023-01-23 NOTE — Telephone Encounter (Signed)
Patient called stating that Laynes did not get his Xanax nor his Adderall when [facility] was [facility].   Patient would like for provider to please send in his Xanax and his Aderall.

## 2023-01-23 NOTE — Telephone Encounter (Signed)
sent 

## 2023-01-25 ENCOUNTER — Other Ambulatory Visit (HOSPITAL_COMMUNITY): Payer: Self-pay | Admitting: Psychiatry

## 2023-01-25 ENCOUNTER — Telehealth (HOSPITAL_COMMUNITY): Payer: Self-pay

## 2023-01-25 MED ORDER — AMPHETAMINE-DEXTROAMPHETAMINE 30 MG PO TABS: 30.0000 mg | ORAL_TABLET | Freq: Two times a day (BID) | ORAL | 0 refills | Status: DC

## 2023-01-25 NOTE — Telephone Encounter (Signed)
Spoke with pt advised correct rx has been sent to pharmacy pt verbalized understanding

## 2023-01-25 NOTE — Telephone Encounter (Signed)
Pt called in and left vm stating that the wrong rx was sent in to pharmacy. Pt is needing amphetamine-dextroamphetamine (ADDERALL) 30 MG tablet sent to [facility] and not the xr capsules. Please advise.

## 2023-01-29 ENCOUNTER — Other Ambulatory Visit (HOSPITAL_COMMUNITY): Payer: Self-pay | Admitting: Psychiatry

## 2023-02-12 ENCOUNTER — Ambulatory Visit (HOSPITAL_COMMUNITY): Payer: 59 | Admitting: Psychiatry

## 2023-02-12 ENCOUNTER — Telehealth (HOSPITAL_COMMUNITY): Payer: Self-pay | Admitting: Psychiatry

## 2023-02-12 NOTE — Telephone Encounter (Signed)
Therapist attempted to contact patient via text through caregility platform for scheduled appointment, no response.  Therapist called patient, left message indicating attempt, and requesting patient call office. 

## 2023-02-25 ENCOUNTER — Other Ambulatory Visit (HOSPITAL_COMMUNITY): Payer: Self-pay | Admitting: Psychiatry

## 2023-03-27 ENCOUNTER — Encounter (HOSPITAL_COMMUNITY): Payer: Self-pay | Admitting: Psychiatry

## 2023-03-27 ENCOUNTER — Telehealth (INDEPENDENT_AMBULATORY_CARE_PROVIDER_SITE_OTHER): Payer: 59 | Admitting: Psychiatry

## 2023-03-27 DIAGNOSIS — F32A Depression, unspecified: Secondary | ICD-10-CM | POA: Diagnosis not present

## 2023-03-27 DIAGNOSIS — F419 Anxiety disorder, unspecified: Secondary | ICD-10-CM | POA: Diagnosis not present

## 2023-03-27 DIAGNOSIS — F900 Attention-deficit hyperactivity disorder, predominantly inattentive type: Secondary | ICD-10-CM

## 2023-03-27 DIAGNOSIS — F988 Other specified behavioral and emotional disorders with onset usually occurring in childhood and adolescence: Secondary | ICD-10-CM | POA: Diagnosis not present

## 2023-03-27 DIAGNOSIS — F331 Major depressive disorder, recurrent, moderate: Secondary | ICD-10-CM

## 2023-03-27 DIAGNOSIS — F411 Generalized anxiety disorder: Secondary | ICD-10-CM

## 2023-03-27 MED ORDER — ESCITALOPRAM OXALATE 10 MG PO TABS: 10.0000 mg | ORAL_TABLET | Freq: Every day | ORAL | 2 refills | Status: DC

## 2023-03-27 MED ORDER — AMPHETAMINE-DEXTROAMPHETAMINE 30 MG PO TABS: 30.0000 mg | ORAL_TABLET | Freq: Two times a day (BID) | ORAL | 0 refills | Status: DC

## 2023-03-27 MED ORDER — ALPRAZOLAM 0.5 MG PO TABS: 0.5000 mg | ORAL_TABLET | Freq: Three times a day (TID) | ORAL | 2 refills | Status: DC | PRN

## 2023-03-27 NOTE — Progress Notes (Signed)
Virtual Visit via Video Note  I connected with Jason [name] on 03/27/23 at 11:00 AM EDT by a video enabled telemedicine application and verified that I am speaking with the correct person using two identifiers.  Location: Patient: home Provider: office   I discussed the limitations of evaluation and management by telemedicine and the availability of in person appointments. The patient expressed understanding and agreed to proceed.    I discussed the assessment and treatment plan with the patient. The patient was provided an opportunity to ask questions and all were answered. The patient agreed with the plan and demonstrated an understanding of the instructions.   The patient was advised to call back or seek an in-person evaluation if the symptoms worsen or if the condition fails to improve as anticipated.  I provided 15 minutes of non-face-to-face time during this encounter.   [name], MD  [facility] MD/PA/NP OP Progress Note  03/27/2023 11:15 AM Jason [name]  MRN:  [id number]  Chief Complaint:  Chief Complaint  Patient presents with   Anxiety   Depression   ADHD   Follow-up   HPI: This patient is a 42 year old married white male lives with his wife in [place].  They have no children.  He works in a [employer].  The patient returns for follow-up after 4 months regarding his depression anxiety and ADD.  Last time he is cut down the Prozac from 40 to 20 mg and he states he is try to cut it back again to 10 mg.  He states that he has more access to his feelings on the lower dose but on the other hand he feels anxious all the time.  He has tried other SSRIs such as Viibryd and Trintellix.  The Trintellix caused hair loss.  Effexor XR also did not work for him.  I suggested that we try Lexapro at a low dose and he is agreeable.  He is focusing well with the Adderall and the Xanax continues to help with the breakthrough anxiety.  He is sleeping and eating well and his energy  is good.  He denies thoughts of self-harm or suicide Visit Diagnosis:    ICD-10-CM   1. Recurrent moderate major depressive disorder with anxiety (HCC)  F33.1    F41.9     2. Attention deficit hyperactivity disorder (ADHD), predominantly inattentive type  F90.0     3. Generalized anxiety disorder  F41.1       Past Psychiatric History: none  Past Medical History:  Past Medical History:  Diagnosis Date   Anxiety disorder    Elevated cholesterol    Hypertension 06/11/2017   Major depressive disorder    History reviewed. No pertinent surgical history.  Family Psychiatric History: See below  Family History:  Family History  Problem Relation Age of Onset   Hypertension Mother    Hyperlipidemia Mother    Hypertension Father    Heart attack Paternal Grandfather     Social History:  Social History   Socioeconomic History   Marital status: Married    Spouse name: Not on file   Number of children: Not on file   Years of education: Not on file   Highest education level: Not on file  Occupational History   Not on file  Tobacco Use   Smoking status: Former   Smokeless tobacco: Never   Tobacco comments:    Quit 06-2008  Substance and Sexual Activity   Alcohol use: Yes    Comment:  occasionally drinks beer, 4 at the most   Drug use: No   Sexual activity: Yes    Birth control/protection: Condom  Other Topics Concern   Not on file  Social History Narrative   Not on file   Social Determinants of Health   Financial Resource Strain: Not on file  Food Insecurity: Not on file  Transportation Needs: Not on file  Physical Activity: Not on file  Stress: Not on file  Social Connections: Not on file    Allergies: No Known Allergies  Metabolic Disorder Labs: No results found for: "HGBA1C", "MPG" No results found for: "PROLACTIN" No results found for: "CHOL", "TRIG", "HDL", "CHOLHDL", "VLDL", "LDLCALC" No results found for: "TSH"  Therapeutic Level Labs: No results  found for: "LITHIUM" No results found for: "VALPROATE" No results found for: "CBMZ"  Current Medications: Current Outpatient Medications  Medication Sig Dispense Refill   escitalopram (LEXAPRO) 10 MG tablet Take 1 tablet (10 mg total) by mouth daily. 30 tablet 2   acyclovir (ZOVIRAX) 200 MG capsule Take 200 mg by mouth 2 (two) times daily as needed. Reported on 01/05/2016  3   ALPRAZolam (XANAX) 0.5 MG tablet Take 1 tablet (0.5 mg total) by mouth 3 (three) times daily as needed for anxiety. 90 tablet 2   amphetamine-dextroamphetamine (ADDERALL) 30 MG tablet Take 1 tablet by mouth 2 (two) times daily. 60 tablet 0   amphetamine-dextroamphetamine (ADDERALL) 30 MG tablet Take 1 tablet by mouth 2 (two) times daily. 60 tablet 0   chlorthalidone (HYGROTON) 25 MG tablet Take 1 tablet (25 mg total) by mouth daily. 90 tablet 1   No current facility-administered medications for this visit.     Musculoskeletal: Strength & Muscle Tone: within normal limits Gait & Station: normal Patient leans: N/A  Psychiatric Specialty Exam: Review of Systems  Psychiatric/Behavioral:  The patient is nervous/anxious.   All other systems reviewed and are negative.   There were no vitals taken for this visit.There is no height or weight on file to calculate BMI.  General Appearance: Casual and Fairly Groomed  Eye Contact:  Good  Speech:  Clear and Coherent  Volume:  Normal  Mood:  Anxious and Euthymic  Affect:  Congruent  Thought Process:  Goal Directed  Orientation:  Full (Time, Place, and Person)  Thought Content: WDL   Suicidal Thoughts:  No  Homicidal Thoughts:  No  Memory:  Immediate;   Good Recent;   Good Remote;   Fair  Judgement:  Good  Insight:  Good  Psychomotor Activity:  Normal  Concentration:  Concentration: Good and Attention Span: Good  Recall:  Good  Fund of Knowledge: Good  Language: Good  Akathisia:  No  Handed:  Right  AIMS (if indicated): not done  Assets:  Communication  Skills Desire for Improvement Physical Health Resilience Social Support Talents/Skills  ADL's:  Intact  Cognition: WNL  Sleep:  Good   Screenings: GAD-7    Advertising copywriter from 12/10/2022 in [facility] at [place] Counselor from 10/01/2022 in [facility] Outpatient [facility] at [place] Counselor from 06/11/2022 in [facility] Outpatient Behavioral Health at [place] Office Visit from 01/29/2022 in [facility] Outpatient Behavioral Health at [place] Counselor from 05/22/2021 in [facility] Outpatient Behavioral Health at [place]  Total GAD-7 Score 20 7 12 3 12       PHQ2-9    Flowsheet Row Counselor from 12/10/2022 in [facility] Outpatient Behavioral Health at [place] Counselor from 10/01/2022 in [facility] from 09/10/2022 in [facility] Outpatient Behavioral Health at [place] Video Visit from 08/06/2022 in [facility] Outpatient Behavioral Health at [place] Video Visit from 06/11/2022 in [facility] Outpatient [facility]al Health at [facility] Total Score 2 1 2 1 5   PHQ-9 Total Score 7 4 9 1 7       Flowsheet Row Video Visit from 08/06/2022 in [facility] Outpatient Behavioral Health at [place] Most recent reading at 08/06/2022  9:21 AM Video Visit from 06/11/2022 in [facility] Outpatient Behavioral Health at [place] Most recent reading at 06/11/2022 11:32 AM Counselor from 06/11/2022 in [facility] Outpatient Behavioral Health at [place] Most recent reading at 06/11/2022  9:14 AM  C-SSRS RISK CATEGORY No Risk No Risk No Risk        Assessment and Plan: This patient is a 42 year old male with a history of depression anxiety ADD and mood swings.  He does feel the Prozac makes him feel too blunted so we will switch to Lexapro 10 mg daily for anxiety and depression.  He will continue Xanax 0.25 mg 3 times daily as needed for breakthrough anxiety and Adderall  30 mg twice daily for ADD.  He will return to see me in 3 months  Collaboration of Care: Collaboration of Care: Primary Care Provider AEB notes will be shared with PCP at patient's request  Patient/Guardian was advised Release of Information must be obtained prior to any record release in order to collaborate their care with an outside provider. Patient/Guardian was advised if they have not already done so to contact the registration department to sign all necessary forms in order for [place] to release information regarding their care.   Consent: Patient/Guardian gives verbal consent for treatment and assignment of benefits for services provided during this visit. Patient/Guardian expressed understanding and agreed to proceed.    [name], MD 03/27/2023, 11:15 AM

## 2023-04-01 ENCOUNTER — Telehealth (HOSPITAL_COMMUNITY): Payer: 59 | Admitting: Psychiatry

## 2023-04-28 ENCOUNTER — Other Ambulatory Visit: Payer: Self-pay | Admitting: Internal Medicine

## 2023-05-08 ENCOUNTER — Encounter (HOSPITAL_COMMUNITY): Payer: Self-pay | Admitting: Psychiatry

## 2023-05-08 ENCOUNTER — Telehealth (INDEPENDENT_AMBULATORY_CARE_PROVIDER_SITE_OTHER): Payer: 59 | Admitting: Psychiatry

## 2023-05-08 DIAGNOSIS — F331 Major depressive disorder, recurrent, moderate: Secondary | ICD-10-CM | POA: Diagnosis not present

## 2023-05-08 DIAGNOSIS — F411 Generalized anxiety disorder: Secondary | ICD-10-CM

## 2023-05-08 DIAGNOSIS — F900 Attention-deficit hyperactivity disorder, predominantly inattentive type: Secondary | ICD-10-CM | POA: Diagnosis not present

## 2023-05-08 MED ORDER — AMPHETAMINE-DEXTROAMPHETAMINE 30 MG PO TABS: 30.0000 mg | ORAL_TABLET | Freq: Two times a day (BID) | ORAL | 0 refills | Status: DC

## 2023-05-08 MED ORDER — ESCITALOPRAM OXALATE 10 MG PO TABS: 10.0000 mg | ORAL_TABLET | Freq: Every day | ORAL | 2 refills | Status: DC

## 2023-05-08 MED ORDER — ALPRAZOLAM 0.5 MG PO TABS: 0.5000 mg | ORAL_TABLET | Freq: Three times a day (TID) | ORAL | 2 refills | Status: DC | PRN

## 2023-05-08 NOTE — Progress Notes (Signed)
Virtual Visit via Video Note  I connected with Jason [name] on 05/08/23 at  9:40 AM EDT by a video enabled telemedicine application and verified that I am speaking with the correct person using two identifiers.  Location: Patient: home Provider: office   I discussed the limitations of evaluation and management by telemedicine and the availability of in person appointments. The patient expressed understanding and agreed to proceed.      I discussed the assessment and treatment plan with the patient. The patient was provided an opportunity to ask questions and all were answered. The patient agreed with the plan and demonstrated an understanding of the instructions.   The patient was advised to call back or seek an in-person evaluation if the symptoms worsen or if the condition fails to improve as anticipated.  I provided 20 minutes of non-face-to-face time during this encounter.   [name], MD  [facility] MD/PA/NP OP Progress Note  05/08/2023 9:50 AM Jason [name]  MRN:  [id number]  Chief Complaint:  Chief Complaint  Patient presents with   Anxiety   Depression   Follow-up   ADD   HPI: This patient is a 42 year old married white male who lives with his wife in [place].  They have no children.  He works in a [employer].  The patient returns for follow-up after 2 months regarding his depression anxiety and ADD.  Last time he wanted to get off Prozac because it was making him feel too blunted.  We have switched to Lexapro 10 mg daily.  He states that he is feeling much better.  His energy is improved and he is less anxious and depressed.  He does not know of any side effects.  He is sleeping well.  He is focusing well with the Adderall and Xanax continues to help with his breakthrough anxiety he denies any thoughts of self-harm or suicide. Visit Diagnosis:    ICD-10-CM   1. Recurrent moderate major depressive disorder with anxiety (HCC)  F33.1    F41.9     2. Attention  deficit hyperactivity disorder (ADHD), predominantly inattentive type  F90.0     3. Generalized anxiety disorder  F41.1       Past Psychiatric History: none  Past Medical History:  Past Medical History:  Diagnosis Date   Anxiety disorder    Elevated cholesterol    Hypertension 06/11/2017   Major depressive disorder    History reviewed. No pertinent surgical history.  Family Psychiatric History: See below  Family History:  Family History  Problem Relation Age of Onset   Hypertension Mother    Hyperlipidemia Mother    Hypertension Father    Heart attack Paternal Grandfather     Social History:  Social History   Socioeconomic History   Marital status: Married    Spouse name: Not on file   Number of children: Not on file   Years of education: Not on file   Highest education level: Not on file  Occupational History   Not on file  Tobacco Use   Smoking status: Former   Smokeless tobacco: Never   Tobacco comments:    Quit 06-2008  Substance and Sexual Activity   Alcohol use: Yes    Comment: occasionally drinks beer, 4 at the most   Drug use: No   Sexual activity: Yes    Birth control/protection: Condom  Other Topics Concern   Not on file  Social History Narrative   Not on file  Social Determinants of Health   Financial Resource Strain: Not on file  Food Insecurity: Not on file  Transportation Needs: Not on file  Physical Activity: Not on file  Stress: Not on file  Social Connections: Not on file    Allergies: No Known Allergies  Metabolic Disorder Labs: No results found for: "HGBA1C", "MPG" No results found for: "PROLACTIN" No results found for: "CHOL", "TRIG", "HDL", "CHOLHDL", "VLDL", "LDLCALC" No results found for: "TSH"  Therapeutic Level Labs: No results found for: "LITHIUM" No results found for: "VALPROATE" No results found for: "CBMZ"  Current Medications: Current Outpatient Medications  Medication Sig Dispense Refill    amphetamine-dextroamphetamine (ADDERALL) 30 MG tablet Take 1 tablet by mouth 2 (two) times daily. 60 tablet 0   acyclovir (ZOVIRAX) 200 MG capsule Take 200 mg by mouth 2 (two) times daily as needed. Reported on 01/05/2016  3   ALPRAZolam (XANAX) 0.5 MG tablet Take 1 tablet (0.5 mg total) by mouth 3 (three) times daily as needed for anxiety. 90 tablet 2   amphetamine-dextroamphetamine (ADDERALL) 30 MG tablet Take 1 tablet by mouth 2 (two) times daily. 60 tablet 0   amphetamine-dextroamphetamine (ADDERALL) 30 MG tablet Take 1 tablet by mouth 2 (two) times daily. Fill after 06/06/2023 60 tablet 0   chlorthalidone (HYGROTON) 25 MG tablet TAKE 1 TABLET BY MOUTH DAILY 30 tablet 5   escitalopram (LEXAPRO) 10 MG tablet Take 1 tablet (10 mg total) by mouth daily. 30 tablet 2   No current facility-administered medications for this visit.     Musculoskeletal: Strength & Muscle Tone: within normal limits Gait & Station: normal Patient leans: N/A  Psychiatric Specialty Exam: Review of Systems  All other systems reviewed and are negative.   There were no vitals taken for this visit.There is no height or weight on file to calculate BMI.  General Appearance: Casual and Fairly Groomed  Eye Contact:  Good  Speech:  Clear and Coherent  Volume:  Normal  Mood:  Euthymic  Affect:  Congruent  Thought Process:  Goal Directed  Orientation:  Full (Time, Place, and Person)  Thought Content: WDL   Suicidal Thoughts:  No  Homicidal Thoughts:  No  Memory:  Immediate;   Good Recent;   Good Remote;   Good  Judgement:  Good  Insight:  Good  Psychomotor Activity:  Normal  Concentration:  Concentration: Good and Attention Span: Good  Recall:  Good  Fund of Knowledge: Good  Language: Good  Akathisia:  No  Handed:  Right  AIMS (if indicated): not done  Assets:  Communication Skills Desire for Improvement Physical Health Resilience Social Support Talents/Skills Vocational/Educational  ADL's:  Intact   Cognition: WNL  Sleep:  Good   Screenings: GAD-7    Advertising copywriter from 12/10/2022 in [facility] at [place] Counselor from 10/01/2022 in [facility] at [place] Counselor from 06/11/2022 in [facility] at [place] Office Visit from 01/29/2022 in [facility] at [place] Counselor from 05/22/2021 in [facility] at [place]  Total GAD-7 Score 20 7 12 3 12       PHQ2-9    Flowsheet Row Counselor from 12/10/2022 in [facility] at [place] Counselor from 10/01/2022 in [facility] at [place] Counselor from 09/10/2022 in [facility] at [place] Video Visit from 08/06/2022 in [facility] Outpatient Behavioral Health at [place] Video Visit from 06/11/2022 in  [place] [facility] at [facility] Total Score 2 1 2 1 5   PHQ-9 Total Score 7 4 9 1 7       Flowsheet Row Video Visit from 08/06/2022 in [facility] Out[facility] at [place] Most recent reading at 08/06/2022  9:21 AM Video Visit from 06/11/2022 in [facility] Outpatient Behavioral Health at [place] Most recent reading at 06/11/2022 11:32 AM Counselor from 06/11/2022 in [facility] Outpatient Behavioral Health at [place] Most recent reading at 06/11/2022  9:14 AM  C-SSRS RISK CATEGORY No Risk No Risk No Risk        Assessment and Plan: This patient is a 42 year old male with a history of depression anxiety ADD and mood swings.  He is doing much better with his anxiety and depression with the Lexapro 10 mg daily.  He will continue this as well as Xanax 0.5 mg 3 times daily as needed for breakthrough anxiety and Adderall 30 mg twice daily for ADD.  He will return to see me in 3 months  Collaboration of Care: Collaboration of Care: Primary Care  Provider AEB notes will be shared with PCP at patient's request  Patient/Guardian was advised Release of Information must be obtained prior to any record release in order to collaborate their care with an outside provider. Patient/Guardian was advised if they have not already done so to contact the registration department to sign all necessary forms in order for [place] to release information regarding their care.   Consent: Patient/Guardian gives verbal consent for treatment and assignment of benefits for services provided during this visit. Patient/Guardian expressed understanding and agreed to proceed.    [name], MD 05/08/2023, 9:50 AM

## 2023-07-31 ENCOUNTER — Other Ambulatory Visit (HOSPITAL_COMMUNITY): Payer: Self-pay | Admitting: Psychiatry

## 2023-08-07 ENCOUNTER — Other Ambulatory Visit (HOSPITAL_COMMUNITY): Payer: Self-pay | Admitting: Psychiatry

## 2023-09-03 ENCOUNTER — Other Ambulatory Visit (HOSPITAL_COMMUNITY): Payer: Self-pay | Admitting: Psychiatry

## 2023-09-03 NOTE — Telephone Encounter (Signed)
 Call for appt

## 2023-09-04 NOTE — Telephone Encounter (Signed)
 Called pt no answer left vm

## 2023-09-17 ENCOUNTER — Telehealth (INDEPENDENT_AMBULATORY_CARE_PROVIDER_SITE_OTHER): Payer: 59 | Admitting: Psychiatry

## 2023-09-17 ENCOUNTER — Encounter (HOSPITAL_COMMUNITY): Payer: Self-pay | Admitting: Psychiatry

## 2023-09-17 DIAGNOSIS — F900 Attention-deficit hyperactivity disorder, predominantly inattentive type: Secondary | ICD-10-CM

## 2023-09-17 DIAGNOSIS — F331 Major depressive disorder, recurrent, moderate: Secondary | ICD-10-CM

## 2023-09-17 DIAGNOSIS — F411 Generalized anxiety disorder: Secondary | ICD-10-CM | POA: Diagnosis not present

## 2023-09-17 MED ORDER — AMPHETAMINE-DEXTROAMPHETAMINE 30 MG PO TABS: 30.0000 mg | ORAL_TABLET | Freq: Two times a day (BID) | ORAL | 0 refills | Status: DC

## 2023-09-17 MED ORDER — ESCITALOPRAM OXALATE 10 MG PO TABS: 10.0000 mg | ORAL_TABLET | Freq: Every day | ORAL | 2 refills | Status: DC

## 2023-09-17 MED ORDER — ALPRAZOLAM 0.5 MG PO TABS: 0.5000 mg | ORAL_TABLET | Freq: Three times a day (TID) | ORAL | 2 refills | Status: DC | PRN

## 2023-09-17 MED ORDER — AMPHETAMINE-DEXTROAMPHETAMINE 30 MG PO TABS: 1.0000 | ORAL_TABLET | Freq: Two times a day (BID) | ORAL | 0 refills | Status: DC

## 2023-09-17 NOTE — Progress Notes (Signed)
Virtual Visit via Video Note  I connected with Jason [name] on 09/17/23 at  9:20 AM EST by a video enabled telemedicine application and verified that I am speaking with the correct person using two identifiers.  Location: Patient: work Provider: office   I discussed the limitations of evaluation and management by telemedicine and the availability of in person appointments. The patient expressed understanding and agreed to proceed.     I discussed the assessment and treatment plan with the patient. The patient was provided an opportunity to ask questions and all were answered. The patient agreed with the plan and demonstrated an understanding of the instructions.   The patient was advised to call back or seek an in-person evaluation if the symptoms worsen or if the condition fails to improve as anticipated.  I provided 20 minutes of non-face-to-face time during this encounter.   [name], MD  [facility] MD/PA/NP OP Progress Note  09/17/2023 9:32 AM Jason [name]  MRN:  [id number]  Chief Complaint:  Chief Complaint  Patient presents with   Depression   ADD   Follow-up   HPI: This patient is a 43 year old married white male who lives with his wife in [place].  He works as a [employer] architect.  The patient returns for follow-up after 5 months regarding his depression anxiety and ADD.  He states that he is felt much improvement in switching from Prozac to Lexapro.  He is less blunted and tired and has more energy.  He states that his mood is good and denies significant depression or thoughts of self-harm.  His anxiety is under good control with the Xanax.  He is focusing well with the Adderall.  His health has been good.  He is sleeping well at night Visit Diagnosis:    ICD-10-CM   1. Recurrent moderate major depressive disorder with anxiety (HCC)  F33.1    F41.9     2. Attention deficit hyperactivity disorder (ADHD), predominantly inattentive type  F90.0     3. Generalized  anxiety disorder  F41.1       Past Psychiatric History: none  Past Medical History:  Past Medical History:  Diagnosis Date   Anxiety disorder    Elevated cholesterol    Hypertension 06/11/2017   Major depressive disorder    History reviewed. No pertinent surgical history.  Family Psychiatric History: See below  Family History:  Family History  Problem Relation Age of Onset   Hypertension Mother    Hyperlipidemia Mother    Hypertension Father    Heart attack Paternal Grandfather     Social History:  Social History   Socioeconomic History   Marital status: Married    Spouse name: Not on file   Number of children: Not on file   Years of education: Not on file   Highest education level: Not on file  Occupational History   Not on file  Tobacco Use   Smoking status: Former   Smokeless tobacco: Never   Tobacco comments:    Quit 06-2008  Substance and Sexual Activity   Alcohol use: Yes    Comment: occasionally drinks beer, 4 at the most   Drug use: No   Sexual activity: Yes    Birth control/protection: Condom  Other Topics Concern   Not on file  Social History Narrative   Not on file   Social Drivers of Health   Financial Resource Strain: Not on file  Food Insecurity: Not on file  Transportation Needs: Not  on file  Physical Activity: Not on file  Stress: Not on file  Social Connections: Not on file    Allergies: No Known Allergies  Metabolic Disorder Labs: No results found for: "HGBA1C", "MPG" No results found for: "PROLACTIN" No results found for: "CHOL", "TRIG", "HDL", "CHOLHDL", "VLDL", "LDLCALC" No results found for: "TSH"  Therapeutic Level Labs: No results found for: "LITHIUM" No results found for: "VALPROATE" No results found for: "CBMZ"  Current Medications: Current Outpatient Medications  Medication Sig Dispense Refill   acyclovir (ZOVIRAX) 200 MG capsule Take 200 mg by mouth 2 (two) times daily as needed. Reported on 01/05/2016  3    ALPRAZolam (XANAX) 0.5 MG tablet Take 1 tablet (0.5 mg total) by mouth 3 (three) times daily as needed for anxiety. 90 tablet 2   amphetamine-dextroamphetamine (ADDERALL) 30 MG tablet Take 1 tablet by mouth 2 (two) times daily. 60 tablet 0   amphetamine-dextroamphetamine (ADDERALL) 30 MG tablet Take 1 tablet by mouth 2 (two) times daily. 60 tablet 0   amphetamine-dextroamphetamine (ADDERALL) 30 MG tablet Take 1 tablet by mouth 2 (two) times daily. 60 tablet 0   chlorthalidone (HYGROTON) 25 MG tablet TAKE 1 TABLET BY MOUTH DAILY 30 tablet 5   escitalopram (LEXAPRO) 10 MG tablet Take 1 tablet (10 mg total) by mouth daily. 90 tablet 2   No current facility-administered medications for this visit.     Musculoskeletal: Strength & Muscle Tone: within normal limits Gait & Station: normal Patient leans: N/A  Psychiatric Specialty Exam: Review of Systems  All other systems reviewed and are negative.   There were no vitals taken for this visit.There is no height or weight on file to calculate BMI.  General Appearance: Casual and Fairly Groomed  Eye Contact:  Good  Speech:  Clear and Coherent  Volume:  Normal  Mood:  Euthymic  Affect:  Congruent  Thought Process:  Goal Directed  Orientation:  Full (Time, Place, and Person)  Thought Content: WDL   Suicidal Thoughts:  No  Homicidal Thoughts:  No  Memory:  Immediate;   Good Recent;   Good Remote;   Good  Judgement:  Good  Insight:  Fair  Psychomotor Activity:  Normal  Concentration:  Concentration: Good and Attention Span: Good  Recall:  Good  Fund of Knowledge: Good  Language: Good  Akathisia:  No  Handed:  Right  AIMS (if indicated): not done  Assets:  Communication Skills Desire for Improvement Physical Health Resilience Social Support Talents/Skills Vocational/Educational  ADL's:  Intact  Cognition: WNL  Sleep:  Good   Screenings: GAD-7    Advertising copywriter from 12/10/2022 in [facility] at [place] Counselor from 10/01/2022 in [facility] Outpatient Behavior[facility] at [place] Counselor from 06/11/2022 in [facility] at [place] Office Visit from 01/29/2022 in [facility] Out[facility] at [place] Counselor from 05/22/2021 in [facility] at [place]  Total GAD-7 Score 20 7 12 3 12       PHQ2-9    Flowsheet Row Counselor from 12/10/2022 in [facility] Outpatient Behavioral Health at [place] Counselor from 10/01/2022 in [facility] Outpatient Behavioral Health at [place] Counselor from 09/10/2022 in [facility] at [place] Video Visit from 08/06/2022 in [facility] Outpatient Behavioral Health at [place] Video Visit from 06/11/2022 in [facility] at [facility] Total Score 2 1 2 1 5   PHQ-9 Total Score 7 4 9 1  7  Flowsheet Row Video Visit from 08/06/2022 in [facility] at [place] Most recent reading at 08/06/2022  9:21 AM Video Visit from 06/11/2022 in [facility]patient Behavioral Health at [place] Most recent reading at 06/11/2022 11:32 AM Counselor from 06/11/2022 in [facility] Outpatient Behavioral Health at [place] Most recent reading at 06/11/2022  9:14 AM  C-SSRS RISK CATEGORY No Risk No Risk No Risk        Assessment and Plan: This patient is a 43 year old male with a history of depression anxiety ADD and mood swings.  He he is doing well on his current regimen.  He will continue Lexapro 10 mg daily for depression and anxiety, Xanax 0.5 mg 3 times daily as needed for breakthrough anxiety and Adderall 30 mg twice daily for ADD.  He will return to see me in 3 months  Collaboration of Care: Collaboration of Care: Primary Care Provider AEB notes will be shared with PCP at patient's request  Patient/Guardian was advised Release of Information must be obtained prior to any  record release in order to collaborate their care with an outside provider. Patient/Guardian was advised if they have not already done so to contact the registration department to sign all necessary forms in order for [place] to release information regarding their care.   Consent: Patient/Guardian gives verbal consent for treatment and assignment of benefits for services provided during this visit. Patient/Guardian expressed understanding and agreed to proceed.    [name], MD 09/17/2023, 9:32 AM

## 2023-10-30 ENCOUNTER — Other Ambulatory Visit: Payer: Self-pay | Admitting: Internal Medicine

## 2023-11-27 ENCOUNTER — Other Ambulatory Visit: Payer: Self-pay | Admitting: Internal Medicine

## 2023-12-13 ENCOUNTER — Other Ambulatory Visit: Payer: Self-pay | Admitting: Internal Medicine

## 2023-12-28 ENCOUNTER — Other Ambulatory Visit (HOSPITAL_COMMUNITY): Payer: Self-pay | Admitting: Psychiatry

## 2024-01-07 ENCOUNTER — Telehealth (INDEPENDENT_AMBULATORY_CARE_PROVIDER_SITE_OTHER): Admitting: Psychiatry

## 2024-01-07 ENCOUNTER — Encounter (HOSPITAL_COMMUNITY): Payer: Self-pay | Admitting: Psychiatry

## 2024-01-07 DIAGNOSIS — F900 Attention-deficit hyperactivity disorder, predominantly inattentive type: Secondary | ICD-10-CM

## 2024-01-07 DIAGNOSIS — F331 Major depressive disorder, recurrent, moderate: Secondary | ICD-10-CM

## 2024-01-07 DIAGNOSIS — F411 Generalized anxiety disorder: Secondary | ICD-10-CM | POA: Diagnosis not present

## 2024-01-07 MED ORDER — ALPRAZOLAM 0.5 MG PO TABS: 0.5000 mg | ORAL_TABLET | Freq: Three times a day (TID) | ORAL | 2 refills | Status: DC | PRN

## 2024-01-07 MED ORDER — ESCITALOPRAM OXALATE 20 MG PO TABS: 20.0000 mg | ORAL_TABLET | Freq: Every day | ORAL | 2 refills | Status: DC

## 2024-01-07 MED ORDER — AMPHETAMINE-DEXTROAMPHETAMINE 30 MG PO TABS: 1.0000 | ORAL_TABLET | Freq: Two times a day (BID) | ORAL | 0 refills | Status: DC

## 2024-01-07 MED ORDER — AMPHETAMINE-DEXTROAMPHETAMINE 30 MG PO TABS: 30.0000 mg | ORAL_TABLET | Freq: Two times a day (BID) | ORAL | 0 refills | Status: DC

## 2024-01-07 NOTE — Progress Notes (Signed)
 Virtual Visit via Video Note  I connected with Jason [name] on 01/07/24 at  9:40 AM EDT by a video enabled telemedicine application and verified that I am speaking with the correct person using two identifiers.  Location: Patient: home Provider: office   I discussed the limitations of evaluation and management by telemedicine and the availability of in person appointments. The patient expressed understanding and agreed to proceed.     I discussed the assessment and treatment plan with the patient. The patient was provided an opportunity to ask questions and all were answered. The patient agreed with the plan and demonstrated an understanding of the instructions.   The patient was advised to call back or seek an in-person evaluation if the symptoms worsen or if the condition fails to improve as anticipated.  I provided 20 minutes of non-face-to-face time during this encounter.   Alfredia Annas, MD  [facility] MD/PA/NP OP Progress Note  01/07/2024 9:36 AM Jason [name]  MRN:  [id number]  Chief Complaint:  Chief Complaint  Patient presents with   Anxiety   Depression   Follow-up   ADD   HPI: This patient is a 43 year old married white male who lives with his wife in [place]. He works as a Naval architect.   The patient returns for follow-up after 4 months regarding his depression anxiety and ADD.  He states overall he is doing well.  He does have some breakthrough depression at times but never suicidal.  He asked if we can go up on the Lexapro  and since he is only on 10 mg I think this is reasonable.  He is been working hard and still enjoying playing music with friends.  He denies any new health concerns.  He is sleeping and eating well. Visit Diagnosis:    ICD-10-CM   1. Recurrent moderate major depressive disorder with anxiety (HCC)  F33.1    F41.9     2. Attention deficit hyperactivity disorder (ADHD), predominantly inattentive type  F90.0     3. Generalized anxiety  disorder  F41.1       Past Psychiatric History: none  Past Medical History:  Past Medical History:  Diagnosis Date   Anxiety disorder    Elevated cholesterol    Hypertension 06/11/2017   Major depressive disorder    History reviewed. No pertinent surgical history.  Family Psychiatric History: see below  Family History:  Family History  Problem Relation Age of Onset   Hypertension Mother    Hyperlipidemia Mother    Hypertension Father    Heart attack Paternal Grandfather     Social History:  Social History   Socioeconomic History   Marital status: Married    Spouse name: Not on file   Number of children: Not on file   Years of education: Not on file   Highest education level: Not on file  Occupational History   Not on file  Tobacco Use   Smoking status: Former   Smokeless tobacco: Never   Tobacco comments:    Quit 06-2008  Substance and Sexual Activity   Alcohol  use: Yes    Comment: occasionally drinks beer, 4 at the most   Drug use: No   Sexual activity: Yes    Birth control/protection: Condom  Other Topics Concern   Not on file  Social History Narrative   Not on file   Social Drivers of Health   Financial Resource Strain: Not on file  Food Insecurity: Not on file  Transportation Needs:  Not on file  Physical Activity: Not on file  Stress: Not on file  Social Connections: Not on file    Allergies: No Known Allergies  Metabolic Disorder Labs: No results found for: "HGBA1C", "MPG" No results found for: "PROLACTIN" No results found for: "CHOL", "TRIG", "HDL", "CHOLHDL", "VLDL", "LDLCALC" No results found for: "TSH"  Therapeutic Level Labs: No results found for: "LITHIUM" No results found for: "VALPROATE" No results found for: "CBMZ"  Current Medications: Current Outpatient Medications  Medication Sig Dispense Refill   escitalopram  (LEXAPRO ) 20 MG tablet Take 1 tablet (20 mg total) by mouth daily. 30 tablet 2   acyclovir (ZOVIRAX) 200 MG  capsule Take 200 mg by mouth 2 (two) times daily as needed. Reported on 01/05/2016  3   ALPRAZolam  (XANAX ) 0.5 MG tablet Take 1 tablet (0.5 mg total) by mouth 3 (three) times daily as needed. for anxiety 90 tablet 2   amphetamine -dextroamphetamine  (ADDERALL) 30 MG tablet Take 1 tablet by mouth 2 (two) times daily. 60 tablet 0   amphetamine -dextroamphetamine  (ADDERALL) 30 MG tablet Take 1 tablet by mouth 2 (two) times daily. 60 tablet 0   amphetamine -dextroamphetamine  (ADDERALL) 30 MG tablet Take 1 tablet by mouth 2 (two) times daily. 60 tablet 0   chlorthalidone  (HYGROTON ) 25 MG tablet TAKE 1 TABLET BY MOUTH DAILY 7 tablet 0   No current facility-administered medications for this visit.     Musculoskeletal: Strength & Muscle Tone: within normal limits Gait & Station: normal Patient leans: N/A  Psychiatric Specialty Exam: Review of Systems  Psychiatric/Behavioral:  Positive for dysphoric mood.   All other systems reviewed and are negative.   There were no vitals taken for this visit.There is no height or weight on file to calculate BMI.  General Appearance: Casual and Fairly Groomed  Eye Contact:  Good  Speech:  Clear and Coherent  Volume:  Normal  Mood:  Euthymic  Affect:  Congruent  Thought Process:  Goal Directed  Orientation:  Full (Time, Place, and Person)  Thought Content: WDL   Suicidal Thoughts:  No  Homicidal Thoughts:  No  Memory:  Immediate;   Good Recent;   Good Remote;   Good  Judgement:  Good  Insight:  Good  Psychomotor Activity:  Normal  Concentration:  Concentration: Good and Attention Span: Good  Recall:  Good  Fund of Knowledge: Good  Language: Good  Akathisia:  No  Handed:  Right  AIMS (if indicated): not done  Assets:  Communication Skills Desire for Improvement Physical Health Resilience Social Support Vocational/Educational  ADL's:  Intact  Cognition: WNL  Sleep:  Good   Screenings: GAD-7    Advertising copywriter from 12/10/2022 in [facility] at [place] Counselor from 10/01/2022 in [facility] at [place] Counselor from 06/11/2022 in [facility] at [place] Office Visit from 01/29/2022 in [facility] at [place] Counselor from 05/22/2021 in [facility] Outpatient Behavioral Health at [place]  Total GAD-7 Score 20 7 12 3 12       PHQ2-9    Flowsheet Row Counselor from 12/10/2022 in [facility] Outpatient Behavioral Health at [place] Counselor from 10/01/2022 in [facility] at [place] Counselor from 09/10/2022 in [facility] Out[facility] at [place] Video Visit from 08/06/2022 in [facility] Outpatient Behavioral Health at [place] Video Visit from 06/11/2022 in [facility] Outpatient Behavioral Health at [facility] Total Score 2 1 2 1  5  PHQ-9 Total Score 7 4 9 1 7       Flowsheet Row Video Visit from 08/06/2022 in [facility] Outpatient Behavioral Health at [place] Most recent reading at 08/06/2022  9:21 AM Video Visit from 06/11/2022 in [facility] Outpatient Behavioral Health at [place] Most recent reading at 06/11/2022 11:32 AM Counselor from 06/11/2022 in [facility] Outpatient Behavioral Health at [place] Most recent reading at 06/11/2022  9:14 AM  C-SSRS RISK CATEGORY No Risk No Risk No Risk        Assessment and Plan: This patient is a 43 year old male with a history depression anxiety ADD and mood swings.  He states that he is at occasional dips in his mood so we will increase Lexapro  to 20 mg daily for depression and anxiety, continue Xanax  0.5 mg 3 times daily as needed for breakthrough anxiety and Adderall 30 mg twice daily for ADD.  He will return to see me in 3 months  Collaboration of Care: Collaboration of Care: Primary Care Provider AEB notes will be shared with PCP at patient's request  Patient/Guardian was advised  Release of Information must be obtained prior to any record release in order to collaborate their care with an outside provider. Patient/Guardian was advised if they have not already done so to contact the registration department to sign all necessary forms in order for us  to release information regarding their care.   Consent: Patient/Guardian gives verbal consent for treatment and assignment of benefits for services provided during this visit. Patient/Guardian expressed understanding and agreed to proceed.    Alfredia Annas, MD 01/07/2024, 9:36 AM

## 2024-03-27 ENCOUNTER — Other Ambulatory Visit (HOSPITAL_COMMUNITY): Payer: Self-pay | Admitting: Psychiatry

## 2024-04-01 ENCOUNTER — Other Ambulatory Visit (HOSPITAL_COMMUNITY): Payer: Self-pay | Admitting: Psychiatry

## 2024-04-01 NOTE — Telephone Encounter (Signed)
 Call for appt

## 2024-04-01 NOTE — Telephone Encounter (Signed)
 Called pt no answer left vm

## 2024-04-03 ENCOUNTER — Telehealth (INDEPENDENT_AMBULATORY_CARE_PROVIDER_SITE_OTHER): Admitting: Psychiatry

## 2024-04-03 ENCOUNTER — Encounter (HOSPITAL_COMMUNITY): Payer: Self-pay | Admitting: Psychiatry

## 2024-04-03 DIAGNOSIS — F331 Major depressive disorder, recurrent, moderate: Secondary | ICD-10-CM | POA: Diagnosis not present

## 2024-04-03 DIAGNOSIS — F900 Attention-deficit hyperactivity disorder, predominantly inattentive type: Secondary | ICD-10-CM

## 2024-04-03 DIAGNOSIS — F419 Anxiety disorder, unspecified: Secondary | ICD-10-CM

## 2024-04-03 DIAGNOSIS — F411 Generalized anxiety disorder: Secondary | ICD-10-CM | POA: Diagnosis not present

## 2024-04-03 MED ORDER — AMPHETAMINE-DEXTROAMPHETAMINE 30 MG PO TABS: 1.0000 | ORAL_TABLET | Freq: Two times a day (BID) | ORAL | 0 refills | Status: AC

## 2024-04-03 MED ORDER — AMPHETAMINE-DEXTROAMPHETAMINE 30 MG PO TABS: 30.0000 mg | ORAL_TABLET | Freq: Two times a day (BID) | ORAL | 0 refills | Status: DC

## 2024-04-03 MED ORDER — ESCITALOPRAM OXALATE 20 MG PO TABS: 20.0000 mg | ORAL_TABLET | Freq: Every day | ORAL | 2 refills | Status: AC

## 2024-04-03 MED ORDER — ALPRAZOLAM 0.5 MG PO TABS: 0.5000 mg | ORAL_TABLET | Freq: Three times a day (TID) | ORAL | 2 refills | Status: DC | PRN

## 2024-04-03 MED ORDER — AMPHETAMINE-DEXTROAMPHETAMINE 30 MG PO TABS
30.0000 mg | ORAL_TABLET | Freq: Two times a day (BID) | ORAL | 0 refills | Status: AC
Start: 2024-04-03 — End: 2025-04-03

## 2024-04-03 NOTE — Progress Notes (Signed)
 Virtual Visit via Video Note  I connected with Jason [name] on 04/03/24 at  9:20 AM EDT by a video enabled telemedicine application and verified that I am speaking with the correct person using two identifiers.  Location: Patient: home Provider: office   I discussed the limitations of evaluation and management by telemedicine and the availability of in person appointments. The patient expressed understanding and agreed to proceed.     I discussed the assessment and treatment plan with the patient. The patient was provided an opportunity to ask questions and all were answered. The patient agreed with the plan and demonstrated an understanding of the instructions.   The patient was advised to call back or seek an in-person evaluation if the symptoms worsen or if the condition fails to improve as anticipated.  I provided 20 minutes of non-face-to-face time during this encounter.   Barnie Gull, MD  [facility] MD/PA/NP OP Progress Note  04/03/2024 9:25 AM Jason [name]  MRN:  [id number]  Chief Complaint:  Chief Complaint  Patient presents with   ADD   Anxiety   Depression   Follow-up   HPI: This patient is a 43 year old married white male who lives with his wife in [place].  He works in a Naval architect.  The patient returns for follow-up after 3 months regarding his depression anxiety and ADD.  Last time he stated he was still having several episodes of mild depression so we increased his Lexapro  to 20 mg daily.  He states he is not having these episodes anymore.  His mood has been good.  The Xanax  controls his anxiety and he is focusing well with the Adderall.  He states that he is staying busy at work and playing music and is also eating and sleeping well. Visit Diagnosis:    ICD-10-CM   1. Recurrent moderate major depressive disorder with anxiety (HCC)  F33.1    F41.9     2. Attention deficit hyperactivity disorder (ADHD), predominantly inattentive type  F90.0     3.  Generalized anxiety disorder  F41.1       Past Psychiatric History: none  Past Medical History:  Past Medical History:  Diagnosis Date   Anxiety disorder    Elevated cholesterol    Hypertension 06/11/2017   Major depressive disorder    History reviewed. No pertinent surgical history.  Family Psychiatric History: See below  Family History:  Family History  Problem Relation Age of Onset   Hypertension Mother    Hyperlipidemia Mother    Hypertension Father    Heart attack Paternal Grandfather     Social History:  Social History   Socioeconomic History   Marital status: Married    Spouse name: Not on file   Number of children: Not on file   Years of education: Not on file   Highest education level: Not on file  Occupational History   Not on file  Tobacco Use   Smoking status: Former   Smokeless tobacco: Never   Tobacco comments:    Quit 06-2008  Substance and Sexual Activity   Alcohol  use: Yes    Comment: occasionally drinks beer, 4 at the most   Drug use: No   Sexual activity: Yes    Birth control/protection: Condom  Other Topics Concern   Not on file  Social History Narrative   Not on file   Social Drivers of Health   Financial Resource Strain: Not on file  Food Insecurity: Not on file  Transportation Needs: Not on file  Physical Activity: Not on file  Stress: Not on file  Social Connections: Not on file    Allergies: No Known Allergies  Metabolic Disorder Labs: No results found for: HGBA1C, MPG No results found for: PROLACTIN No results found for: CHOL, TRIG, HDL, CHOLHDL, VLDL, LDLCALC No results found for: TSH  Therapeutic Level Labs: No results found for: LITHIUM No results found for: VALPROATE No results found for: CBMZ  Current Medications: Current Outpatient Medications  Medication Sig Dispense Refill   acyclovir (ZOVIRAX) 200 MG capsule Take 200 mg by mouth 2 (two) times daily as needed. Reported on  01/05/2016  3   ALPRAZolam  (XANAX ) 0.5 MG tablet Take 1 tablet (0.5 mg total) by mouth 3 (three) times daily as needed. for anxiety 90 tablet 2   amphetamine -dextroamphetamine  (ADDERALL) 30 MG tablet Take 1 tablet by mouth 2 (two) times daily. 60 tablet 0   amphetamine -dextroamphetamine  (ADDERALL) 30 MG tablet Take 1 tablet by mouth 2 (two) times daily. 60 tablet 0   amphetamine -dextroamphetamine  (ADDERALL) 30 MG tablet Take 1 tablet by mouth 2 (two) times daily. 60 tablet 0   chlorthalidone  (HYGROTON ) 25 MG tablet TAKE 1 TABLET BY MOUTH DAILY 7 tablet 0   escitalopram  (LEXAPRO ) 20 MG tablet Take 1 tablet (20 mg total) by mouth daily. 30 tablet 2   No current facility-administered medications for this visit.     Musculoskeletal: Strength & Muscle Tone: within normal limits Gait & Station: normal Patient leans: N/A  Psychiatric Specialty Exam: Review of Systems  All other systems reviewed and are negative.   There were no vitals taken for this visit.There is no height or weight on file to calculate BMI.  General Appearance: Casual and Fairly Groomed  Eye Contact:  Good  Speech:  Clear and Coherent  Volume:  Normal  Mood:  Euthymic  Affect:  Congruent  Thought Process:  Goal Directed  Orientation:  Full (Time, Place, and Person)  Thought Content: WDL   Suicidal Thoughts:  No  Homicidal Thoughts:  No  Memory:  Immediate;   Good Recent;   Good Remote;   Good  Judgement:  Good  Insight:  Good  Psychomotor Activity:  Normal  Concentration:  Concentration: Good and Attention Span: Good  Recall:  Good  Fund of Knowledge: Good  Language: Good  Akathisia:  No  Handed:  Right  AIMS (if indicated): not done  Assets:  Communication Skills Desire for Improvement Physical Health Resilience Social Support Talents/Skills Vocational/Educational  ADL's:  Intact  Cognition: WNL  Sleep:  Good   Screenings: GAD-7    Advertising copywriter from 12/10/2022 in [facility] at [place] Counselor from 10/01/2022 in [facility] Out[facility] at [place] Counselor from 06/11/2022 in [facility] at [place] Office Visit from 01/29/2022 in [facility] at [place] Counselor from 05/22/2021 in [facility] Outpatient Behavioral Health at [place]  Total GAD-7 Score 20 7 12 3 12    PHQ2-9    Flowsheet Row Counselor from 12/10/2022 in [facility] Outpatient Behavioral Health at [place] Counselor from 10/01/2022 in [facility] Outpatient Behavioral Health at [place] Counselor from 09/10/2022 in [facility] Outpatient Behavioral Health at [place] Video Visit from 08/06/2022 in [facility] Health Outpatient Behavioral Health at [place] Video Visit from 06/11/2022 in [facility] Outpatient Behavioral [facility] at [facility] Total Score 2 1 2 1 5   PHQ-9 Total Score 7 4 9 1  7  Flowsheet Row Video Visit from 08/06/2022 in [facility]al [facility] at [place] Most recent reading at 08/06/2022  9:21 AM Video Visit from 06/11/2022 in [facility] Outpatient Behavioral Health at [place] Most recent reading at 06/11/2022 11:32 AM Counselor from 06/11/2022 in [facility] Outpatient Behavioral Health at [place] Most recent reading at 06/11/2022  9:14 AM  C-SSRS RISK CATEGORY No Risk No Risk No Risk     Assessment and Plan: This patient is a 43 year old male with a history of depression anxiety ADD and mood swings.  He is very stable on his current regimen.  He will continue Lexapro  20 mg daily for depression and anxiety, Xanax  0.5 mg 3 times daily as needed for breakthrough anxiety and Adderall 30 mg twice daily for ADD.  He will return to see me in 3 months  Collaboration of Care: Collaboration of Care: Primary Care Provider AEB notes will be shared with PCP at patient's request  Patient/Guardian was advised Release of Information must be obtained prior to any  record release in order to collaborate their care with an outside provider. Patient/Guardian was advised if they have not already done so to contact the registration department to sign all necessary forms in order for us  to release information regarding their care.   Consent: Patient/Guardian gives verbal consent for treatment and assignment of benefits for services provided during this visit. Patient/Guardian expressed understanding and agreed to proceed.    Barnie Gull, MD 04/03/2024, 9:25 AM

## 2024-06-18 ENCOUNTER — Other Ambulatory Visit (HOSPITAL_COMMUNITY): Payer: Self-pay | Admitting: Psychiatry

## 2024-07-28 ENCOUNTER — Other Ambulatory Visit (HOSPITAL_COMMUNITY): Payer: Self-pay | Admitting: Psychiatry

## 2024-07-28 ENCOUNTER — Telehealth (HOSPITAL_COMMUNITY): Payer: Self-pay

## 2024-07-28 MED ORDER — AMPHETAMINE-DEXTROAMPHETAMINE 30 MG PO TABS: 30.0000 mg | ORAL_TABLET | Freq: Two times a day (BID) | ORAL | 0 refills | Status: DC

## 2024-07-28 NOTE — Telephone Encounter (Signed)
 Pt called in requesting a refill on his amphetamine -dextroamphetamine  (ADDERALL) 30 MG tablet  sent to [facility]. Pt scheduled 08/07/24. Please advise.

## 2024-07-28 NOTE — Telephone Encounter (Signed)
 sent

## 2024-07-28 NOTE — Telephone Encounter (Signed)
Pt aware rx sent to pharmacy.

## 2024-08-07 ENCOUNTER — Telehealth (INDEPENDENT_AMBULATORY_CARE_PROVIDER_SITE_OTHER): Admitting: Psychiatry

## 2024-08-07 ENCOUNTER — Encounter (HOSPITAL_COMMUNITY): Payer: Self-pay | Admitting: Psychiatry

## 2024-08-07 DIAGNOSIS — F900 Attention-deficit hyperactivity disorder, predominantly inattentive type: Secondary | ICD-10-CM

## 2024-08-07 DIAGNOSIS — F411 Generalized anxiety disorder: Secondary | ICD-10-CM

## 2024-08-07 DIAGNOSIS — F331 Major depressive disorder, recurrent, moderate: Secondary | ICD-10-CM

## 2024-08-07 NOTE — Progress Notes (Signed)
 Virtual Visit via Video Note  I connected with Jason [name] on 08/07/2024 at  9:40 AM EST by a video enabled telemedicine application and verified that I am speaking with the correct person using two identifiers.  Location: Patient: home Provider: office   I discussed the limitations of evaluation and management by telemedicine and the availability of in person appointments. The patient expressed understanding and agreed to proceed.     I discussed the assessment and treatment plan with the patient. The patient was provided an opportunity to ask questions and all were answered. The patient agreed with the plan and demonstrated an understanding of the instructions.   The patient was advised to call back or seek an in-person evaluation if the symptoms worsen or if the condition fails to improve as anticipated.  I provided 20 minutes of non-face-to-face time during this encounter.   Barnie Gull, MD  [facility] MD/PA/NP OP Progress Note  08/07/2024 9:56 AM Jason [name]  MRN:  [id number]  Chief Complaint:  Chief Complaint  Patient presents with   ADD   Anxiety   Depression   Follow-up   HPI: This patient is a 43 year old married white male who lives with his wife in [place]. He works in a naval architect.   The patient returns for follow-up after about 6 months regarding major depression generalized anxiety disorder and ADD.  He is doing very well by his report.  He denies significant depression anxiety thoughts of self-harm.  He is still enjoying his work for the most part.  He spends a lot of his free time playing music.  He is sleeping well and his energy is good.  He denies any current side effects.  He is focusing well. Visit Diagnosis:    ICD-10-CM   1. Recurrent moderate major depressive disorder with anxiety (HCC)  F33.1    F41.9     2. Attention deficit hyperactivity disorder (ADHD), predominantly inattentive type  F90.0     3. Generalized anxiety disorder  F41.1        Past Psychiatric History: none  Past Medical History:  Past Medical History:  Diagnosis Date   Anxiety disorder    Elevated cholesterol    Hypertension 06/11/2017   Major depressive disorder    History reviewed. No pertinent surgical history.  Family Psychiatric History: see below  Family History:  Family History  Problem Relation Age of Onset   Hypertension Mother    Hyperlipidemia Mother    Hypertension Father    Heart attack Paternal Grandfather     Social History:  Social History   Socioeconomic History   Marital status: Married    Spouse name: Not on file   Number of children: Not on file   Years of education: Not on file   Highest education level: Not on file  Occupational History   Not on file  Tobacco Use   Smoking status: Former   Smokeless tobacco: Never   Tobacco comments:    Quit 06-2008  Substance and Sexual Activity   Alcohol  use: Yes    Comment: occasionally drinks beer, 4 at the most   Drug use: No   Sexual activity: Yes    Birth control/protection: Condom  Other Topics Concern   Not on file  Social History Narrative   Not on file   Social Drivers of Health   Tobacco Use: Medium Risk (08/07/2024)   Patient History    Smoking Tobacco Use: Former    Smokeless Tobacco  Use: Never    Passive Exposure: Not on file  Financial Resource Strain: Not on file  Food Insecurity: Not on file  Transportation Needs: Not on file  Physical Activity: Not on file  Stress: Not on file  Social Connections: Not on file  Depression (PHQ2-9): Medium Risk (12/10/2022)   Depression (PHQ2-9)    PHQ-2 Score: 7  Alcohol  Screen: Not on file  Housing: Not on file  Utilities: Not on file  Health Literacy: Not on file    Allergies: Allergies[1]  Metabolic Disorder Labs: No results found for: HGBA1C, MPG No results found for: PROLACTIN No results found for: CHOL, TRIG, HDL, CHOLHDL, VLDL, LDLCALC No results found for:  TSH  Therapeutic Level Labs: No results found for: LITHIUM No results found for: VALPROATE No results found for: CBMZ  Current Medications: Current Outpatient Medications  Medication Sig Dispense Refill   acyclovir (ZOVIRAX) 200 MG capsule Take 200 mg by mouth 2 (two) times daily as needed. Reported on 01/05/2016  3   ALPRAZolam  (XANAX ) 0.5 MG tablet Take 1 tablet (0.5 mg total) by mouth 3 (three) times daily as needed. for anxiety 90 tablet 2   amphetamine -dextroamphetamine  (ADDERALL) 30 MG tablet Take 1 tablet by mouth 2 (two) times daily. 60 tablet 0   amphetamine -dextroamphetamine  (ADDERALL) 30 MG tablet Take 1 tablet by mouth 2 (two) times daily. 60 tablet 0   amphetamine -dextroamphetamine  (ADDERALL) 30 MG tablet Take 1 tablet by mouth 2 (two) times daily. 60 tablet 0   chlorthalidone  (HYGROTON ) 25 MG tablet TAKE 1 TABLET BY MOUTH DAILY 7 tablet 0   escitalopram  (LEXAPRO ) 20 MG tablet Take 1 tablet (20 mg total) by mouth daily. 30 tablet 2   No current facility-administered medications for this visit.     Musculoskeletal: Strength & Muscle Tone: within normal limits Gait & Station: normal Patient leans: N/A  Psychiatric Specialty Exam: Review of Systems  All other systems reviewed and are negative.   There were no vitals taken for this visit.There is no height or weight on file to calculate BMI.  General Appearance: Casual and Fairly Groomed  Eye Contact:  Good  Speech:  Clear and Coherent  Volume:  Normal  Mood:  Euthymic  Affect:  Congruent  Thought Process:  Goal Directed  Orientation:  Full (Time, Place, and Person)  Thought Content: WDL   Suicidal Thoughts:  No  Homicidal Thoughts:  No  Memory:  Immediate;   Good Recent;   Good Remote;   Good  Judgement:  Good  Insight:  Good  Psychomotor Activity:  Normal  Concentration:  Concentration: Good and Attention Span: Good  Recall:  Good  Fund of Knowledge: Good  Language: Good  Akathisia:  No  Handed:   Right  AIMS (if indicated): not done  Assets:  Communication Skills Desire for Improvement Physical Health Resilience Social Support Talents/Skills  ADL's:  Intact  Cognition: WNL  Sleep:  Good   Screenings: GAD-7    Advertising Copywriter from 12/10/2022 in [facility] [place] Counselor from 10/01/2022 in [facility] [place] Counselor from 06/11/2022 in [facility] [place] Office Visit from 01/29/2022 in [facility] at [place] Counselor from 05/22/2021 in [facility] at [place]  Total GAD-7 Score 20 7 12 3 12    PHQ2-9    Flowsheet Row Counselor from 12/10/2022 in [facility] [place] Counselor from 10/01/2022 in [place]  [facility] from 09/10/2022 in [facility] at [place] Video Visit from 08/06/2022 in [facility] at [place] Video Visit from 06/11/2022 in [facility] Total Score 2 1 2 1 5   PHQ-9 Total Score 7 4 9 1 7    Flowsheet Row Video Visit from 08/06/2022 in [facility] at [place] Most recent reading at 08/06/2022  9:21 AM Video Visit from 06/11/2022 in [facility] at [place] Most recent reading at 06/11/2022 11:32 AM Counselor from 06/11/2022 in [facility] at [place] Most recent reading at 06/11/2022  9:14 AM  C-SSRS RISK CATEGORY No Risk No Risk No Risk     Assessment and Plan: This patient is a 43 year old male with a history of major depression generalized anxiety ADD.  He is stable on his current regimen.  He will continue Lexapro  20 mg daily for major depression and generalized anxiety, Xanax  0.5 mg 3 times daily as needed for  breakthrough anxiety and Adderall 30 mg twice daily for ADD.  He will return to see me in 6 months  Collaboration of Care: Collaboration of Care: Primary Care Provider AEB notes will be shared with PCP at patient's request  Patient/Guardian was advised Release of Information must be obtained prior to any record release in order to collaborate their care with an outside provider. Patient/Guardian was advised if they have not already done so to contact the registration department to sign all necessary forms in order for us  to release information regarding their care.   Consent: Patient/Guardian gives verbal consent for treatment and assignment of benefits for services provided during this visit. Patient/Guardian expressed understanding and agreed to proceed.    Barnie Gull, MD 08/07/2024, 9:56 AM     [1] No Known Allergies

## 2024-08-24 ENCOUNTER — Other Ambulatory Visit (HOSPITAL_COMMUNITY): Payer: Self-pay | Admitting: Psychiatry

## 2024-08-26 ENCOUNTER — Telehealth (HOSPITAL_COMMUNITY): Payer: Self-pay

## 2024-08-26 NOTE — Telephone Encounter (Signed)
 Pt called in requesting a refill on his amphetamine -dextroamphetamine  (ADDERALL) 30 MG tablet sent to [name]. Pt scheduled with Dr Okey for follow up on 02/05/25. Please advised last filled 07/28/24.

## 2024-08-31 NOTE — Telephone Encounter (Signed)
 Pt aware.

## 2024-08-31 NOTE — Telephone Encounter (Signed)
 Sent yesterday

## 2024-08-31 NOTE — Telephone Encounter (Signed)
 I was out on PAL.  Requested sent to the covering physician.

## 2024-09-13 ENCOUNTER — Other Ambulatory Visit (HOSPITAL_COMMUNITY): Payer: Self-pay | Admitting: Psychiatry

## 2024-09-22 ENCOUNTER — Other Ambulatory Visit (HOSPITAL_COMMUNITY): Payer: Self-pay | Admitting: Psychiatry

## 2025-02-05 ENCOUNTER — Telehealth (HOSPITAL_COMMUNITY): Admitting: Psychiatry
